# Patient Record
Sex: Male | Born: 1955 | Race: White | Hispanic: No | Marital: Married | Smoking: Never smoker
Health system: Southern US, Community
[De-identification: ages and names within clinical notes are randomized; demographics above are authoritative.]

## PROBLEM LIST (undated history)

## (undated) DIAGNOSIS — Z923 Personal history of irradiation: Secondary | ICD-10-CM

## (undated) DIAGNOSIS — Z87442 Personal history of urinary calculi: Secondary | ICD-10-CM

## (undated) DIAGNOSIS — R351 Nocturia: Secondary | ICD-10-CM

## (undated) DIAGNOSIS — C679 Malignant neoplasm of bladder, unspecified: Secondary | ICD-10-CM

## (undated) DIAGNOSIS — I351 Nonrheumatic aortic (valve) insufficiency: Secondary | ICD-10-CM

## (undated) DIAGNOSIS — H269 Unspecified cataract: Secondary | ICD-10-CM

## (undated) DIAGNOSIS — Z8546 Personal history of malignant neoplasm of prostate: Secondary | ICD-10-CM

## (undated) DIAGNOSIS — I10 Essential (primary) hypertension: Secondary | ICD-10-CM

## (undated) DIAGNOSIS — R002 Palpitations: Secondary | ICD-10-CM

## (undated) DIAGNOSIS — K219 Gastro-esophageal reflux disease without esophagitis: Secondary | ICD-10-CM

## (undated) DIAGNOSIS — R1310 Dysphagia, unspecified: Secondary | ICD-10-CM

## (undated) DIAGNOSIS — N401 Enlarged prostate with lower urinary tract symptoms: Secondary | ICD-10-CM

## (undated) DIAGNOSIS — N189 Chronic kidney disease, unspecified: Secondary | ICD-10-CM

## (undated) DIAGNOSIS — Z9079 Acquired absence of other genital organ(s): Secondary | ICD-10-CM

## (undated) DIAGNOSIS — N35919 Unspecified urethral stricture, male, unspecified site: Secondary | ICD-10-CM

## (undated) DIAGNOSIS — N420 Calculus of prostate: Secondary | ICD-10-CM

## (undated) DIAGNOSIS — C61 Malignant neoplasm of prostate: Secondary | ICD-10-CM

## (undated) DIAGNOSIS — Z973 Presence of spectacles and contact lenses: Secondary | ICD-10-CM

## (undated) DIAGNOSIS — IMO0002 Reserved for concepts with insufficient information to code with codable children: Secondary | ICD-10-CM

## (undated) DIAGNOSIS — N138 Other obstructive and reflux uropathy: Secondary | ICD-10-CM

## (undated) DIAGNOSIS — R361 Hematospermia: Secondary | ICD-10-CM

## (undated) HISTORY — DX: Malignant neoplasm of bladder, unspecified: C67.9

## (undated) HISTORY — PX: COLONOSCOPY: SHX174

## (undated) HISTORY — DX: Reserved for concepts with insufficient information to code with codable children: IMO0002

## (undated) HISTORY — DX: Gastro-esophageal reflux disease without esophagitis: K21.9

## (undated) HISTORY — DX: Acquired absence of other genital organ(s): Z90.79

---

## 2011-01-23 ENCOUNTER — Ambulatory Visit (INDEPENDENT_AMBULATORY_CARE_PROVIDER_SITE_OTHER): Payer: Managed Care, Other (non HMO) | Admitting: Urology

## 2011-01-23 DIAGNOSIS — R972 Elevated prostate specific antigen [PSA]: Secondary | ICD-10-CM

## 2011-01-23 DIAGNOSIS — N401 Enlarged prostate with lower urinary tract symptoms: Secondary | ICD-10-CM

## 2011-07-17 ENCOUNTER — Ambulatory Visit (INDEPENDENT_AMBULATORY_CARE_PROVIDER_SITE_OTHER): Payer: Managed Care, Other (non HMO) | Admitting: Urology

## 2011-07-17 DIAGNOSIS — N401 Enlarged prostate with lower urinary tract symptoms: Secondary | ICD-10-CM

## 2011-07-17 DIAGNOSIS — R972 Elevated prostate specific antigen [PSA]: Secondary | ICD-10-CM

## 2012-01-15 ENCOUNTER — Ambulatory Visit (INDEPENDENT_AMBULATORY_CARE_PROVIDER_SITE_OTHER): Payer: Managed Care, Other (non HMO) | Admitting: Urology

## 2012-01-15 DIAGNOSIS — R972 Elevated prostate specific antigen [PSA]: Secondary | ICD-10-CM

## 2012-01-15 DIAGNOSIS — N401 Enlarged prostate with lower urinary tract symptoms: Secondary | ICD-10-CM

## 2012-08-26 ENCOUNTER — Ambulatory Visit (INDEPENDENT_AMBULATORY_CARE_PROVIDER_SITE_OTHER): Payer: Managed Care, Other (non HMO) | Admitting: Urology

## 2012-08-26 DIAGNOSIS — N401 Enlarged prostate with lower urinary tract symptoms: Secondary | ICD-10-CM

## 2012-08-26 DIAGNOSIS — R972 Elevated prostate specific antigen [PSA]: Secondary | ICD-10-CM

## 2012-12-16 ENCOUNTER — Ambulatory Visit (INDEPENDENT_AMBULATORY_CARE_PROVIDER_SITE_OTHER): Payer: Managed Care, Other (non HMO) | Admitting: Urology

## 2012-12-16 DIAGNOSIS — N403 Nodular prostate with lower urinary tract symptoms: Secondary | ICD-10-CM

## 2012-12-16 DIAGNOSIS — R972 Elevated prostate specific antigen [PSA]: Secondary | ICD-10-CM

## 2013-01-04 DIAGNOSIS — C61 Malignant neoplasm of prostate: Secondary | ICD-10-CM

## 2013-01-04 HISTORY — PX: PROSTATE BIOPSY: SHX241

## 2013-01-04 HISTORY — DX: Malignant neoplasm of prostate: C61

## 2013-01-31 ENCOUNTER — Encounter: Payer: Self-pay | Admitting: Radiation Oncology

## 2013-01-31 NOTE — Progress Notes (Signed)
New Consult Prostate Cancer BX=01/04/13=Adenocarcinoma,gleason 3+4=7,& 3+3=6,Volume=63cc,PSA=3.94  Married,1 son,alert,oriented x3, no c/o pain, nocturia x3, -has frequency and urgency, regular bowels   Allergies:NKDA  No history of Radiation' No history of a Pacemaker Interested in [employer] Implantation

## 2013-02-01 ENCOUNTER — Ambulatory Visit
Admission: RE | Admit: 2013-02-01 | Discharge: 2013-02-01 | Disposition: A | Discharge status: Home / Self Care | Payer: Managed Care, Other (non HMO) | Source: Ambulatory Visit | Attending: Radiation Oncology | Admitting: Radiation Oncology

## 2013-02-01 ENCOUNTER — Encounter: Payer: Self-pay | Admitting: Radiation Oncology

## 2013-02-01 VITALS — BP 143/79 | HR 64 | Temp 98.4°F | Resp 20 | Ht 70.0 in | Wt 213.1 lb

## 2013-02-01 DIAGNOSIS — N138 Other obstructive and reflux uropathy: Secondary | ICD-10-CM | POA: Insufficient documentation

## 2013-02-01 DIAGNOSIS — C61 Malignant neoplasm of prostate: Secondary | ICD-10-CM | POA: Insufficient documentation

## 2013-02-01 DIAGNOSIS — I129 Hypertensive chronic kidney disease with stage 1 through stage 4 chronic kidney disease, or unspecified chronic kidney disease: Secondary | ICD-10-CM | POA: Insufficient documentation

## 2013-02-01 DIAGNOSIS — N189 Chronic kidney disease, unspecified: Secondary | ICD-10-CM | POA: Insufficient documentation

## 2013-02-01 HISTORY — DX: Hematospermia: R36.1

## 2013-02-01 HISTORY — DX: Unspecified cataract: H26.9

## 2013-02-01 HISTORY — DX: Nonrheumatic aortic (valve) insufficiency: I35.1

## 2013-02-01 HISTORY — DX: Benign prostatic hyperplasia with lower urinary tract symptoms: N40.1

## 2013-02-01 HISTORY — DX: Other obstructive and reflux uropathy: N13.8

## 2013-02-01 HISTORY — DX: Essential (primary) hypertension: I10

## 2013-02-01 HISTORY — DX: Nocturia: R35.1

## 2013-02-01 HISTORY — DX: Malignant neoplasm of prostate: C61

## 2013-02-01 HISTORY — DX: Chronic kidney disease, unspecified: N18.9

## 2013-02-01 NOTE — Progress Notes (Signed)
Please see the Nurse Progress Note in the MD Initial Consult Encounter for this patient. 

## 2013-02-02 ENCOUNTER — Encounter: Payer: Self-pay | Admitting: Radiation Oncology

## 2013-02-02 NOTE — Progress Notes (Signed)
Radiation Oncology         [phone]) [phone] ________________________________  Initial outpatient Consultation  Name: Jack Mcdonald MRN: [id number]  Date: 02/01/2013  DOB: 1956/05/23  CC:No primary provider on file.  Jack Crete, MD   REFERRING PHYSICIAN: Anner Crete, MD  DIAGNOSIS: 57 y.o. 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 gentleman.  He was noted to have an elevated PSA of 4.32 in 2011.  He was offered a biopsy at that time, but he elected to monitor the PSA, which decreased to less than 4, until it rose above 5 in Feb 2013, and subsequently dropped to 3.17, then, 3.86, and most recently 3.94 on 12/14/12.  His clinical evaluation at that time in urology by Dr. Annabell Howells on 1/17 included digital rectal examination revealing a 2+ prostate with a right apical nodule.  The patient proceeded to transrectal ultrasound with 12 biopsies of the prostate on 01/04/13.  The prostate volume measured 63 cc.  Out of 12 core biopsies,3 were positive.  The maximum Gleason score was 3+4, and this was seen in two cores from the right mid gland.  3+3 was seen in the left mid and apex.  The patient reviewed the biopsy results with his urologist and he has kindly been referred today for discussion of potential radiation treatment options.  PREVIOUS RADIATION THERAPY: No  PAST MEDICAL HISTORY:  has a past medical history of BPH with urinary obstruction; Hematospermia; Chronic kidney disease; Nocturia; Prostate cancer (01/04/13); Aortic regurgitation; Hypertension; and Cataract.    PAST SURGICAL HISTORY: Past Surgical History  Procedure Laterality Date  . Prostate biopsy  01/04/13    Adenocarncinoma    FAMILY HISTORY: family history is not on file.  SOCIAL HISTORY:  reports that he has never smoked. He has never used smokeless tobacco. He reports that  drinks alcohol. He reports that he does not  use illicit drugs.  ALLERGIES: Review of patient's allergies indicates no known allergies.  MEDICATIONS:  Current Outpatient Prescriptions  Medication Sig Dispense Refill  . aspirin 81 MG tablet Take 81 mg by mouth daily.      [name] b complex vitamins capsule Take 1 capsule by mouth daily.      . Flaxseed, Linseed, (FLAX SEEDS PO) Take 1 capsule by mouth daily.      . metoprolol (LOPRESSOR) 50 MG tablet Take 50 mg by mouth daily.      [name] telmisartan (MICARDIS) 40 MG tablet Take 40 mg by mouth daily.       No current facility-administered medications for this encounter.    REVIEW OF SYSTEMS:  A 15 point review of systems is documented in the electronic medical record. This was obtained by the nursing staff. However, I reviewed this with the patient to discuss relevant findings and make appropriate changes.  A comprehensive review of systems was negative..  The patient completed an IPSS and IIEF questionnaire.  His IPSS score was 15 indicating moderate urinary outflow obstructive symptoms.  He indicated that his erectile function is able to complete sexual activity on most attempts.   PHYSICAL EXAM: This patient is in no acute distress.  He is alert and oriented.   height is 5\' 10"  (1.778 m) and weight is 213 lb 1.6 oz (96.662 kg). His oral temperature is 98.4 F (36.9 C). His blood pressure is 143/79 and his pulse is 64. His  respiration is 20.  He exhibits no respiratory distress or labored breathing.  He appears neurologically intact.  His mood is pleasant.  His affect is appropriate.  Please note the digital rectal exam findings described above.  LABORATORY DATA:  No results found for this basename: WBC, HGB, HCT, MCV, PLT   No results found for this basename: NA, K, CL, CO2   No results found for this basename: ALT, AST, GGT, ALKPHOS, BILITOT     RADIOGRAPHY: No results found.    IMPRESSION: This gentleman is a 57 y.o. gentleman with stage T2a adenocarcinoma of the prostate with a  Gleason's score of 3+4 and a PSA of 3.94.  His T-Stage, Gleason's Score, and PSA put him into the intermediate risk group. However, his low volume of Gleason's 7 involvement and primary Gleason grade of 3 position him in a select subset of Gleason's 7 patients eligible for brachytherapy as monotherapy.  He is eligible for a variety of potential treatment options including prostatectomy, IMRT, and brachytherapy.  PLAN:Today I reviewed the findings and workup thus far.  We discussed the natural history of prostate cancer.  We reviewed the the implications of T-stage, Gleason's Score, and PSA on decision-making and outcomes in prostate cancer.  We discussed radiation treatment in the management of prostate cancer with regard to the logistics and delivery of external beam radiation treatment as well as the logistics and delivery of prostate brachytherapy.  We compared and contrasted each of these approaches and also compared these against prostatectomy.  The patient expressed interest in prostate brachytherapy.   Given his age, prostate enlargement, and urinary symptoms, we talked about some of the possible advantages of prostatectomy in this setting.  I counseled him frankly about the likelihood for obstructive symptoms/issues following seeds.  We also discussed possible proscar or avodart fr 3 months prior to implant to help mitigate these symptoms by downsizing the gland  The patient is still deciding about options.    I will share my findings with Dr. Annabell Howells and the patient will notify him once he reaches a decision.     I enjoyed meeting with him today, and will look forward to participating in the care of this very nice gentleman.  I spent 60 minutes face to face with the patient and more than 50% of that time was spent in counseling and/or coordination of care.   ------------------------------------------------  Artist Pais. Jack Mcdonald, M.D.

## 2013-02-02 NOTE — Addendum Note (Signed)
Encounter addended by: Oneita Hurt, MD on: 02/02/2013  5:31 PM<BR>     Documentation filed: LOS Section, Follow-up Section, Notes Section, Visit Diagnoses

## 2014-01-26 ENCOUNTER — Ambulatory Visit (INDEPENDENT_AMBULATORY_CARE_PROVIDER_SITE_OTHER): Payer: Managed Care, Other (non HMO) | Admitting: Urology

## 2014-01-26 DIAGNOSIS — N401 Enlarged prostate with lower urinary tract symptoms: Secondary | ICD-10-CM

## 2014-01-26 DIAGNOSIS — N138 Other obstructive and reflux uropathy: Secondary | ICD-10-CM

## 2014-01-26 DIAGNOSIS — C61 Malignant neoplasm of prostate: Secondary | ICD-10-CM

## 2014-08-03 ENCOUNTER — Ambulatory Visit (INDEPENDENT_AMBULATORY_CARE_PROVIDER_SITE_OTHER): Payer: Managed Care, Other (non HMO) | Admitting: Urology

## 2014-08-03 DIAGNOSIS — N403 Nodular prostate with lower urinary tract symptoms: Secondary | ICD-10-CM

## 2014-08-03 DIAGNOSIS — N138 Other obstructive and reflux uropathy: Secondary | ICD-10-CM

## 2014-08-03 DIAGNOSIS — C61 Malignant neoplasm of prostate: Secondary | ICD-10-CM

## 2015-02-01 ENCOUNTER — Ambulatory Visit (INDEPENDENT_AMBULATORY_CARE_PROVIDER_SITE_OTHER): Payer: BLUE CROSS/BLUE SHIELD | Admitting: Urology

## 2015-02-01 DIAGNOSIS — R312 Other microscopic hematuria: Secondary | ICD-10-CM | POA: Diagnosis not present

## 2015-02-01 DIAGNOSIS — R1030 Lower abdominal pain, unspecified: Secondary | ICD-10-CM

## 2015-02-01 DIAGNOSIS — N403 Nodular prostate with lower urinary tract symptoms: Secondary | ICD-10-CM

## 2015-02-01 DIAGNOSIS — Z8546 Personal history of malignant neoplasm of prostate: Secondary | ICD-10-CM | POA: Diagnosis not present

## 2015-02-01 DIAGNOSIS — N200 Calculus of kidney: Secondary | ICD-10-CM

## 2015-08-09 ENCOUNTER — Ambulatory Visit (INDEPENDENT_AMBULATORY_CARE_PROVIDER_SITE_OTHER): Payer: BLUE CROSS/BLUE SHIELD | Admitting: Urology

## 2015-08-09 DIAGNOSIS — R310 Gross hematuria: Secondary | ICD-10-CM | POA: Diagnosis not present

## 2015-08-09 DIAGNOSIS — Z8546 Personal history of malignant neoplasm of prostate: Secondary | ICD-10-CM | POA: Diagnosis not present

## 2015-08-09 DIAGNOSIS — N3040 Irradiation cystitis without hematuria: Secondary | ICD-10-CM | POA: Diagnosis not present

## 2015-11-08 ENCOUNTER — Ambulatory Visit (INDEPENDENT_AMBULATORY_CARE_PROVIDER_SITE_OTHER): Payer: BLUE CROSS/BLUE SHIELD | Admitting: Urology

## 2015-11-08 DIAGNOSIS — N401 Enlarged prostate with lower urinary tract symptoms: Secondary | ICD-10-CM | POA: Diagnosis not present

## 2015-11-08 DIAGNOSIS — N3040 Irradiation cystitis without hematuria: Secondary | ICD-10-CM | POA: Diagnosis not present

## 2015-11-08 DIAGNOSIS — N200 Calculus of kidney: Secondary | ICD-10-CM | POA: Diagnosis not present

## 2015-11-08 DIAGNOSIS — Z8546 Personal history of malignant neoplasm of prostate: Secondary | ICD-10-CM | POA: Diagnosis not present

## 2015-11-08 DIAGNOSIS — R310 Gross hematuria: Secondary | ICD-10-CM

## 2015-12-01 DIAGNOSIS — D126 Benign neoplasm of colon, unspecified: Secondary | ICD-10-CM

## 2015-12-01 HISTORY — DX: Benign neoplasm of colon, unspecified: D12.6

## 2016-05-29 ENCOUNTER — Ambulatory Visit (INDEPENDENT_AMBULATORY_CARE_PROVIDER_SITE_OTHER): Payer: BLUE CROSS/BLUE SHIELD | Admitting: Urology

## 2016-05-29 DIAGNOSIS — N3040 Irradiation cystitis without hematuria: Secondary | ICD-10-CM

## 2016-05-29 DIAGNOSIS — Z8546 Personal history of malignant neoplasm of prostate: Secondary | ICD-10-CM | POA: Diagnosis not present

## 2016-05-29 DIAGNOSIS — N200 Calculus of kidney: Secondary | ICD-10-CM

## 2016-05-29 DIAGNOSIS — N401 Enlarged prostate with lower urinary tract symptoms: Secondary | ICD-10-CM | POA: Diagnosis not present

## 2016-06-01 ENCOUNTER — Encounter (INDEPENDENT_AMBULATORY_CARE_PROVIDER_SITE_OTHER): Payer: Self-pay | Admitting: *Deleted

## 2016-06-08 ENCOUNTER — Encounter: Payer: Self-pay | Admitting: Gastroenterology

## 2016-07-31 ENCOUNTER — Ambulatory Visit (AMBULATORY_SURGERY_CENTER): Payer: Self-pay | Admitting: *Deleted

## 2016-07-31 VITALS — Ht 70.0 in | Wt 203.0 lb

## 2016-07-31 DIAGNOSIS — Z1211 Encounter for screening for malignant neoplasm of colon: Secondary | ICD-10-CM

## 2016-07-31 MED ORDER — NA SULFATE-K SULFATE-MG SULF 17.5-3.13-1.6 GM/177ML PO SOLN: 1.0000 | Freq: Once | ORAL | 0 refills | Status: AC

## 2016-07-31 NOTE — Progress Notes (Signed)
No egg or soy allergy known to patient  No issues with past sedation with any surgeries  or procedures, no intubation problems  No diet pills per patient No home 02 use per patient  No blood thinners per patient  Pt denies issues with constipation  No A fib or A flutter   Last colon 10+ yrs ago in [place]- normal per pt- had pt sign records release to get old report. To patty lewis RN  Sees cardio in [place]- states extra beat- aortic root enlargement <5, AV that is enlarged and leaking- Last echo with in the year or about a yr and EF was normal per pt-  Had pt sign records release for cardio reports, last echo etc.  Informed John of this above, pt states he can walk 2 flights of stairs with no issues.  Per Jenny Reichmann unless records state otherwise, ok for colon in [facility]

## 2016-08-14 ENCOUNTER — Encounter: Payer: Self-pay | Admitting: Gastroenterology

## 2016-08-14 ENCOUNTER — Ambulatory Visit (AMBULATORY_SURGERY_CENTER): Payer: BLUE CROSS/BLUE SHIELD | Admitting: Gastroenterology

## 2016-08-14 VITALS — BP 119/65 | HR 75 | Temp 98.0°F | Resp 18 | Ht 70.0 in | Wt 203.0 lb

## 2016-08-14 DIAGNOSIS — D123 Benign neoplasm of transverse colon: Secondary | ICD-10-CM

## 2016-08-14 DIAGNOSIS — Z1211 Encounter for screening for malignant neoplasm of colon: Secondary | ICD-10-CM

## 2016-08-14 MED ORDER — SODIUM CHLORIDE 0.9 % IV SOLN: 500.0000 mL | INTRAVENOUS | Status: DC

## 2016-08-14 NOTE — Patient Instructions (Signed)
Discharge instructions given. Handouts on polyps and diverticulosis. Resume previous medications. YOU HAD AN ENDOSCOPIC PROCEDURE TODAY AT THE [facility]:   Refer to the procedure report that was given to you for any specific questions about what was found during the examination.  If the procedure report does not answer your questions, please call your gastroenterologist to clarify.  If you requested that your care partner not be given the details of your procedure findings, then the procedure report has been included in a sealed envelope for you to review at your convenience later.  YOU SHOULD EXPECT: Some feelings of bloating in the abdomen. Passage of more gas than usual.  Walking can help get rid of the air that was put into your GI tract during the procedure and reduce the bloating. If you had a lower endoscopy (such as a colonoscopy or flexible sigmoidoscopy) you may notice spotting of blood in your stool or on the toilet paper. If you underwent a bowel prep for your procedure, you may not have a normal bowel movement for a few days.  Please Note:  You might notice some irritation and congestion in your nose or some drainage.  This is from the oxygen used during your procedure.  There is no need for concern and it should clear up in a day or so.  SYMPTOMS TO REPORT IMMEDIATELY:   Following lower endoscopy (colonoscopy or flexible sigmoidoscopy):  Excessive amounts of blood in the stool  Significant tenderness or worsening of abdominal pains  Swelling of the abdomen that is new, acute  Fever of 100F or higher   For urgent or emergent issues, a gastroenterologist can be reached at any hour by calling [phone].   DIET:  We do recommend a small meal at first, but then you may proceed to your regular diet.  Drink plenty of fluids but you should avoid alcoholic beverages for 24 hours.  ACTIVITY:  You should plan to take it easy for the rest of today and you should NOT  DRIVE or use heavy machinery until tomorrow (because of the sedation medicines used during the test).    FOLLOW UP: Our staff will call the number listed on your records the next business day following your procedure to check on you and address any questions or concerns that you may have regarding the information given to you following your procedure. If we do not reach you, we will leave a message.  However, if you are feeling well and you are not experiencing any problems, there is no need to return our call.  We will assume that you have returned to your regular daily activities without incident.  If any biopsies were taken you will be contacted by phone or by letter within the next 1-3 weeks.  Please call us at [phone] if you have not heard about the biopsies in 3 weeks.    SIGNATURES/CONFIDENTIALITY: You and/or your care partner have signed paperwork which will be entered into your electronic medical record.  These signatures attest to the fact that that the information above on your After Visit Summary has been reviewed and is understood.  Full responsibility of the confidentiality of this discharge information lies with you and/or your care-partner. 

## 2016-08-14 NOTE — Op Note (Signed)
[facility] Patient Name: Jack Mcdonald Procedure Date: 08/14/2016 10:21 AM MRN: [id number] Endoscopist: Ladene Artist , MD Age: 60 Referring MD:  Date of Birth: 10/28/1956 Gender: Male Account #: [id number] Procedure:                Colonoscopy Indications:              Screening for colorectal malignant neoplasm Medicines:                Monitored Anesthesia Care Procedure:                Pre-Anesthesia Assessment:                           - Prior to the procedure, a History and Physical                            was performed, and patient medications and                            allergies were reviewed. The patient's tolerance of                            previous anesthesia was also reviewed. The risks                            and benefits of the procedure and the sedation                            options and risks were discussed with the patient.                            All questions were answered, and informed consent                            was obtained. Prior Anticoagulants: The patient has                            taken no previous anticoagulant or antiplatelet                            agents. ASA Grade Assessment: II - A patient with                            mild systemic disease. After reviewing the risks                            and benefits, the patient was deemed in                            satisfactory condition to undergo the procedure.                           After obtaining informed consent, the colonoscope  was passed under direct vision. Throughout the                            procedure, the patient's blood pressure, pulse, and                            oxygen saturations were monitored continuously. The                            Model PCF-[id number]) scope was introduced                            through the anus and advanced to the the cecum,                            identified by  appendiceal orifice and ileocecal                            valve. The ileocecal valve, appendiceal orifice,                            and rectum were photographed. The quality of the                            bowel preparation was excellent. The colonoscopy                            was performed without difficulty. The patient                            tolerated the procedure well. Scope In: 10:25:01 AM Scope Out: 10:36:13 AM Scope Withdrawal Time: 0 hours 10 minutes 24 seconds  Total Procedure Duration: 0 hours 11 minutes 12 seconds  Findings:                 A 6 mm polyp was found in the transverse colon. The                            polyp was sessile. The polyp was removed with a                            cold snare. Resection and retrieval were complete.                           A few small-mouthed diverticula were found in the                            sigmoid colon.                           The exam was otherwise without abnormality on                            direct and retroflexion views.  A few medium-sized localized angiodysplastic                            lesions without bleeding were found in the rectum                            c/w mild radiation proctopathy. Complications:            No immediate complications. Estimated blood loss:                            None. Estimated Blood Loss:     Estimated blood loss: none. Impression:               - One 6 mm polyp in the transverse colon, removed                            with a cold snare. Resected and retrieved.                           - Mild radiation proctopathy                           - Diverticulosis in the sigmoid colon.                           - The examination was otherwise normal on direct                            and retroflexion views. Recommendation:           - Repeat colonoscopy in 5 years for surveillance if                            polyp is precancerous,  otherwise 10 years.                           - Patient has a contact number available for                            emergencies. The signs and symptoms of potential                            delayed complications were discussed with the                            patient. Return to normal activities tomorrow.                            Written discharge instructions were provided to the                            patient.                           - Resume previous diet.                           -  Continue present medications.                           - Await pathology results. Ladene Artist, MD 08/14/2016 10:41:29 AM This report has been signed electronically.

## 2016-08-14 NOTE — Progress Notes (Signed)
Report to PACU, RN, vss, BBS= Clear.  

## 2016-08-14 NOTE — Progress Notes (Signed)
Called to room for pathology. 

## 2016-08-17 ENCOUNTER — Telehealth: Payer: Self-pay | Admitting: *Deleted

## 2016-08-17 NOTE — Telephone Encounter (Signed)
No identifier, left message, follow-up  

## 2016-08-25 ENCOUNTER — Encounter: Payer: Self-pay | Admitting: Gastroenterology

## 2016-08-28 ENCOUNTER — Encounter: Payer: Self-pay | Admitting: Gastroenterology

## 2016-11-27 ENCOUNTER — Ambulatory Visit (INDEPENDENT_AMBULATORY_CARE_PROVIDER_SITE_OTHER): Payer: BLUE CROSS/BLUE SHIELD | Admitting: Urology

## 2016-11-27 DIAGNOSIS — C61 Malignant neoplasm of prostate: Secondary | ICD-10-CM | POA: Diagnosis not present

## 2016-11-27 DIAGNOSIS — N401 Enlarged prostate with lower urinary tract symptoms: Secondary | ICD-10-CM

## 2016-11-27 DIAGNOSIS — R351 Nocturia: Secondary | ICD-10-CM

## 2017-05-28 ENCOUNTER — Ambulatory Visit: Payer: BLUE CROSS/BLUE SHIELD | Admitting: Urology

## 2017-05-28 ENCOUNTER — Ambulatory Visit (INDEPENDENT_AMBULATORY_CARE_PROVIDER_SITE_OTHER): Payer: BLUE CROSS/BLUE SHIELD | Admitting: Urology

## 2017-05-28 DIAGNOSIS — N401 Enlarged prostate with lower urinary tract symptoms: Secondary | ICD-10-CM | POA: Diagnosis not present

## 2017-05-28 DIAGNOSIS — C61 Malignant neoplasm of prostate: Secondary | ICD-10-CM

## 2017-11-26 ENCOUNTER — Ambulatory Visit (INDEPENDENT_AMBULATORY_CARE_PROVIDER_SITE_OTHER): Payer: PRIVATE HEALTH INSURANCE | Admitting: Urology

## 2017-11-26 DIAGNOSIS — N401 Enlarged prostate with lower urinary tract symptoms: Secondary | ICD-10-CM | POA: Diagnosis not present

## 2017-11-26 DIAGNOSIS — Z87442 Personal history of urinary calculi: Secondary | ICD-10-CM | POA: Diagnosis not present

## 2017-11-26 DIAGNOSIS — Z8546 Personal history of malignant neoplasm of prostate: Secondary | ICD-10-CM | POA: Diagnosis not present

## 2017-11-26 DIAGNOSIS — N3040 Irradiation cystitis without hematuria: Secondary | ICD-10-CM

## 2017-11-26 DIAGNOSIS — R351 Nocturia: Secondary | ICD-10-CM | POA: Diagnosis not present

## 2018-05-27 ENCOUNTER — Ambulatory Visit (INDEPENDENT_AMBULATORY_CARE_PROVIDER_SITE_OTHER): Payer: BLUE CROSS/BLUE SHIELD | Admitting: Urology

## 2018-05-27 DIAGNOSIS — Z8546 Personal history of malignant neoplasm of prostate: Secondary | ICD-10-CM

## 2018-05-27 DIAGNOSIS — R351 Nocturia: Secondary | ICD-10-CM | POA: Diagnosis not present

## 2018-05-27 DIAGNOSIS — R310 Gross hematuria: Secondary | ICD-10-CM | POA: Diagnosis not present

## 2018-05-27 DIAGNOSIS — Z87442 Personal history of urinary calculi: Secondary | ICD-10-CM

## 2018-05-27 DIAGNOSIS — N401 Enlarged prostate with lower urinary tract symptoms: Secondary | ICD-10-CM | POA: Diagnosis not present

## 2018-11-18 ENCOUNTER — Ambulatory Visit (INDEPENDENT_AMBULATORY_CARE_PROVIDER_SITE_OTHER): Payer: BLUE CROSS/BLUE SHIELD | Admitting: Urology

## 2018-11-18 DIAGNOSIS — Z8546 Personal history of malignant neoplasm of prostate: Secondary | ICD-10-CM | POA: Diagnosis not present

## 2018-11-18 DIAGNOSIS — Z87442 Personal history of urinary calculi: Secondary | ICD-10-CM

## 2018-11-18 DIAGNOSIS — N401 Enlarged prostate with lower urinary tract symptoms: Secondary | ICD-10-CM

## 2018-11-18 DIAGNOSIS — R351 Nocturia: Secondary | ICD-10-CM | POA: Diagnosis not present

## 2019-03-16 ENCOUNTER — Ambulatory Visit: Payer: BLUE CROSS/BLUE SHIELD | Admitting: Gastroenterology

## 2019-10-13 ENCOUNTER — Other Ambulatory Visit (HOSPITAL_COMMUNITY)
Admission: RE | Admit: 2019-10-13 | Discharge: 2019-10-13 | Disposition: A | Discharge status: Home / Self Care | Payer: Medicare Other | Source: Other Acute Inpatient Hospital | Attending: Urology | Admitting: Urology

## 2019-10-13 ENCOUNTER — Other Ambulatory Visit: Payer: Self-pay

## 2019-10-13 ENCOUNTER — Ambulatory Visit (INDEPENDENT_AMBULATORY_CARE_PROVIDER_SITE_OTHER): Payer: Medicare Other | Admitting: Urology

## 2019-10-13 DIAGNOSIS — Z8546 Personal history of malignant neoplasm of prostate: Secondary | ICD-10-CM | POA: Diagnosis not present

## 2019-10-13 DIAGNOSIS — R310 Gross hematuria: Secondary | ICD-10-CM | POA: Diagnosis not present

## 2019-10-13 DIAGNOSIS — R300 Dysuria: Secondary | ICD-10-CM | POA: Diagnosis not present

## 2019-10-13 DIAGNOSIS — N401 Enlarged prostate with lower urinary tract symptoms: Secondary | ICD-10-CM

## 2019-10-13 DIAGNOSIS — Z87442 Personal history of urinary calculi: Secondary | ICD-10-CM

## 2019-10-13 DIAGNOSIS — N3040 Irradiation cystitis without hematuria: Secondary | ICD-10-CM

## 2019-10-13 LAB — URINALYSIS, COMPLETE (UACMP) WITH MICROSCOPIC
Bacteria, UA: NONE SEEN
Bilirubin Urine: NEGATIVE
Glucose, UA: NEGATIVE mg/dL
Ketones, ur: NEGATIVE mg/dL
Nitrite: NEGATIVE
Protein, ur: 30 mg/dL — AB
Specific Gravity, Urine: 1.018 (ref 1.005–1.030)
WBC, UA: >50 WBC/hpf — ABNORMAL HIGH (ref 0–5)
pH: 7 (ref 5.0–8.0)

## 2019-10-14 LAB — URINE CULTURE: Culture: NO GROWTH

## 2019-10-17 ENCOUNTER — Other Ambulatory Visit: Payer: Self-pay | Admitting: Urology

## 2019-10-17 ENCOUNTER — Other Ambulatory Visit (HOSPITAL_COMMUNITY): Payer: Self-pay | Admitting: Urology

## 2019-10-17 DIAGNOSIS — R310 Gross hematuria: Secondary | ICD-10-CM

## 2019-10-20 ENCOUNTER — Other Ambulatory Visit: Payer: Self-pay | Admitting: Urology

## 2019-10-20 ENCOUNTER — Other Ambulatory Visit (HOSPITAL_COMMUNITY)
Admission: RE | Admit: 2019-10-20 | Discharge: 2019-10-20 | Disposition: A | Discharge status: Home / Self Care | Payer: Medicare Other | Source: Ambulatory Visit | Attending: Urology | Admitting: Urology

## 2019-10-20 ENCOUNTER — Ambulatory Visit (INDEPENDENT_AMBULATORY_CARE_PROVIDER_SITE_OTHER): Payer: Medicare Other | Admitting: Urology

## 2019-10-20 DIAGNOSIS — R310 Gross hematuria: Secondary | ICD-10-CM

## 2019-10-20 DIAGNOSIS — D414 Neoplasm of uncertain behavior of bladder: Secondary | ICD-10-CM | POA: Diagnosis not present

## 2019-10-21 ENCOUNTER — Other Ambulatory Visit (HOSPITAL_COMMUNITY)
Admission: RE | Admit: 2019-10-21 | Discharge: 2019-10-21 | Disposition: A | Discharge status: Home / Self Care | Payer: Medicare Other | Source: Ambulatory Visit | Attending: Urology | Admitting: Urology

## 2019-10-21 DIAGNOSIS — Z01812 Encounter for preprocedural laboratory examination: Secondary | ICD-10-CM | POA: Diagnosis present

## 2019-10-21 DIAGNOSIS — Z20828 Contact with and (suspected) exposure to other viral communicable diseases: Secondary | ICD-10-CM | POA: Insufficient documentation

## 2019-10-21 LAB — SARS CORONAVIRUS 2 (TAT 6-24 HRS): SARS Coronavirus 2: NEGATIVE

## 2019-10-21 MED ORDER — GEMCITABINE CHEMO FOR BLADDER INSTILLATION 2000 MG: 2000.0000 mg | Freq: Once | INTRAVENOUS | Status: AC

## 2019-10-23 LAB — CYTOLOGY - NON PAP

## 2019-11-03 ENCOUNTER — Other Ambulatory Visit: Payer: Self-pay

## 2019-11-03 ENCOUNTER — Ambulatory Visit (HOSPITAL_COMMUNITY)
Admission: RE | Admit: 2019-11-03 | Discharge: 2019-11-03 | Disposition: A | Discharge status: Home / Self Care | Payer: Medicare Other | Source: Ambulatory Visit | Attending: Urology | Admitting: Urology

## 2019-11-03 DIAGNOSIS — R310 Gross hematuria: Secondary | ICD-10-CM | POA: Insufficient documentation

## 2019-11-03 LAB — POCT I-STAT CREATININE: Creatinine, Ser: 0.8 mg/dL (ref 0.61–1.24)

## 2019-11-03 MED ORDER — IOHEXOL 300 MG/ML  SOLN
125.0000 mL | Freq: Once | INTRAMUSCULAR | Status: AC | PRN
  Administered 2019-11-03: 125 mL via INTRAVENOUS

## 2019-11-06 ENCOUNTER — Other Ambulatory Visit (HOSPITAL_COMMUNITY)
Admission: RE | Admit: 2019-11-06 | Discharge: 2019-11-06 | Disposition: A | Discharge status: Home / Self Care | Payer: Medicare Other | Source: Ambulatory Visit | Attending: Urology | Admitting: Urology

## 2019-11-06 ENCOUNTER — Other Ambulatory Visit: Payer: Self-pay

## 2019-11-06 ENCOUNTER — Encounter (HOSPITAL_BASED_OUTPATIENT_CLINIC_OR_DEPARTMENT_OTHER): Payer: Self-pay | Admitting: *Deleted

## 2019-11-06 DIAGNOSIS — Z20828 Contact with and (suspected) exposure to other viral communicable diseases: Secondary | ICD-10-CM | POA: Diagnosis not present

## 2019-11-06 DIAGNOSIS — Z01812 Encounter for preprocedural laboratory examination: Secondary | ICD-10-CM | POA: Diagnosis present

## 2019-11-06 NOTE — Progress Notes (Addendum)
Spoke w/ via phone for pre-op interview---[facility] needs dos----   I stat 8, ekg           Lab results------lov dr Carrolyn Leigh cardiology 09-21-18 on chart, echo 06-24-19 dr Gibson Ramp on chart COVID test ------11-06-2019 Arrive at -------915 am NPO after ------midnight Medications to take morning of surgery -----metoprolol Diabetic medication -----n/a Patient Special Instructions ----- Pre-Op special Istructions ----- Patient verbalized understanding of instructions that were given at this phone interview. Patient denies shortness of breath, chest pain, fever, cough a this phone interview.

## 2019-11-07 ENCOUNTER — Encounter (HOSPITAL_BASED_OUTPATIENT_CLINIC_OR_DEPARTMENT_OTHER): Payer: Self-pay | Admitting: *Deleted

## 2019-11-07 LAB — NOVEL CORONAVIRUS, NAA (HOSP ORDER, SEND-OUT TO REF LAB; TAT 18-24 HRS): SARS-CoV-2, NAA: NOT DETECTED

## 2019-11-09 ENCOUNTER — Ambulatory Visit (HOSPITAL_BASED_OUTPATIENT_CLINIC_OR_DEPARTMENT_OTHER)
Admission: RE | Admit: 2019-11-09 | Discharge: 2019-11-09 | Disposition: A | Discharge status: Home / Self Care | Payer: Medicare Other | Attending: Urology | Admitting: Urology

## 2019-11-09 ENCOUNTER — Ambulatory Visit (HOSPITAL_BASED_OUTPATIENT_CLINIC_OR_DEPARTMENT_OTHER): Payer: Medicare Other | Admitting: Anesthesiology

## 2019-11-09 ENCOUNTER — Encounter (HOSPITAL_BASED_OUTPATIENT_CLINIC_OR_DEPARTMENT_OTHER): Payer: Self-pay | Admitting: Urology

## 2019-11-09 ENCOUNTER — Other Ambulatory Visit: Payer: Self-pay

## 2019-11-09 ENCOUNTER — Encounter (HOSPITAL_BASED_OUTPATIENT_CLINIC_OR_DEPARTMENT_OTHER)
Admission: RE | Disposition: A | Discharge status: Home / Self Care | Payer: Self-pay | Source: Home / Self Care | Attending: Urology

## 2019-11-09 DIAGNOSIS — K219 Gastro-esophageal reflux disease without esophagitis: Secondary | ICD-10-CM | POA: Diagnosis not present

## 2019-11-09 DIAGNOSIS — I10 Essential (primary) hypertension: Secondary | ICD-10-CM | POA: Insufficient documentation

## 2019-11-09 DIAGNOSIS — Z8546 Personal history of malignant neoplasm of prostate: Secondary | ICD-10-CM | POA: Diagnosis not present

## 2019-11-09 DIAGNOSIS — Z87442 Personal history of urinary calculi: Secondary | ICD-10-CM | POA: Insufficient documentation

## 2019-11-09 DIAGNOSIS — Z841 Family history of disorders of kidney and ureter: Secondary | ICD-10-CM | POA: Diagnosis not present

## 2019-11-09 DIAGNOSIS — R310 Gross hematuria: Secondary | ICD-10-CM | POA: Diagnosis not present

## 2019-11-09 DIAGNOSIS — C670 Malignant neoplasm of trigone of bladder: Secondary | ICD-10-CM | POA: Insufficient documentation

## 2019-11-09 DIAGNOSIS — C675 Malignant neoplasm of bladder neck: Secondary | ICD-10-CM | POA: Diagnosis not present

## 2019-11-09 DIAGNOSIS — Z923 Personal history of irradiation: Secondary | ICD-10-CM | POA: Insufficient documentation

## 2019-11-09 DIAGNOSIS — Z79899 Other long term (current) drug therapy: Secondary | ICD-10-CM | POA: Insufficient documentation

## 2019-11-09 DIAGNOSIS — R1310 Dysphagia, unspecified: Secondary | ICD-10-CM | POA: Diagnosis not present

## 2019-11-09 DIAGNOSIS — Z8249 Family history of ischemic heart disease and other diseases of the circulatory system: Secondary | ICD-10-CM | POA: Insufficient documentation

## 2019-11-09 DIAGNOSIS — C61 Malignant neoplasm of prostate: Secondary | ICD-10-CM | POA: Insufficient documentation

## 2019-11-09 HISTORY — DX: Dysphagia, unspecified: R13.10

## 2019-11-09 HISTORY — DX: Personal history of urinary calculi: Z87.442

## 2019-11-09 HISTORY — DX: Nonrheumatic aortic (valve) insufficiency: I35.1

## 2019-11-09 HISTORY — PX: TRANSURETHRAL RESECTION OF BLADDER TUMOR WITH MITOMYCIN-C: SHX6459

## 2019-11-09 LAB — POCT I-STAT, CHEM 8
BUN: 9 mg/dL (ref 8–23)
Calcium, Ion: 1.26 mmol/L (ref 1.15–1.40)
Chloride: 102 mmol/L (ref 98–111)
Creatinine, Ser: 0.6 mg/dL — ABNORMAL LOW (ref 0.61–1.24)
Glucose, Bld: 99 mg/dL (ref 70–99)
HCT: 43 % (ref 39.0–52.0)
Hemoglobin: 14.6 g/dL (ref 13.0–17.0)
Potassium: 4 mmol/L (ref 3.5–5.1)
Sodium: 137 mmol/L (ref 135–145)
TCO2: 25 mmol/L (ref 22–32)

## 2019-11-09 SURGERY — TRANSURETHRAL RESECTION OF BLADDER TUMOR WITH MITOMYCIN-C
Anesthesia: General | Site: Bladder | Laterality: Bilateral

## 2019-11-09 MED ORDER — LIDOCAINE HCL (CARDIAC) PF 100 MG/5ML IV SOSY
PREFILLED_SYRINGE | INTRAVENOUS | Status: DC | PRN
  Administered 2019-11-09: 60 mg via INTRAVENOUS

## 2019-11-09 MED ORDER — PROPOFOL 10 MG/ML IV BOLUS
INTRAVENOUS | Status: AC
  Filled 2019-11-09: qty 20

## 2019-11-09 MED ORDER — LIDOCAINE 2% (20 MG/ML) 5 ML SYRINGE
INTRAMUSCULAR | Status: AC
  Filled 2019-11-09: qty 5

## 2019-11-09 MED ORDER — FENTANYL CITRATE (PF) 100 MCG/2ML IJ SOLN
25.0000 ug | INTRAMUSCULAR | Status: DC | PRN
  Filled 2019-11-09: qty 1

## 2019-11-09 MED ORDER — MIDAZOLAM HCL 2 MG/2ML IJ SOLN
INTRAMUSCULAR | Status: AC
  Filled 2019-11-09: qty 2

## 2019-11-09 MED ORDER — DEXAMETHASONE SODIUM PHOSPHATE 4 MG/ML IJ SOLN
INTRAMUSCULAR | Status: DC | PRN
  Administered 2019-11-09: 10 mg via INTRAVENOUS

## 2019-11-09 MED ORDER — ONDANSETRON HCL 4 MG/2ML IJ SOLN
INTRAMUSCULAR | Status: DC | PRN
  Administered 2019-11-09: 4 mg via INTRAVENOUS

## 2019-11-09 MED ORDER — PROPOFOL 10 MG/ML IV BOLUS
INTRAVENOUS | Status: DC | PRN
  Administered 2019-11-09: 50 mg via INTRAVENOUS
  Administered 2019-11-09: 150 mg via INTRAVENOUS

## 2019-11-09 MED ORDER — GEMCITABINE CHEMO FOR BLADDER INSTILLATION 2000 MG
2000.0000 mg | Freq: Once | INTRAVENOUS | Status: DC
  Filled 2019-11-09: qty 52.6

## 2019-11-09 MED ORDER — CEFAZOLIN SODIUM-DEXTROSE 2-4 GM/100ML-% IV SOLN
2.0000 g | INTRAVENOUS | Status: AC
  Administered 2019-11-09: 2 g via INTRAVENOUS
  Filled 2019-11-09: qty 100

## 2019-11-09 MED ORDER — DEXAMETHASONE SODIUM PHOSPHATE 10 MG/ML IJ SOLN
INTRAMUSCULAR | Status: AC
  Filled 2019-11-09: qty 1

## 2019-11-09 MED ORDER — SODIUM CHLORIDE 0.9 % IR SOLN
Status: DC | PRN
  Administered 2019-11-09: 6000 mL via INTRAVESICAL

## 2019-11-09 MED ORDER — ONDANSETRON HCL 4 MG/2ML IJ SOLN
INTRAMUSCULAR | Status: AC
  Filled 2019-11-09: qty 2

## 2019-11-09 MED ORDER — FENTANYL CITRATE (PF) 100 MCG/2ML IJ SOLN
INTRAMUSCULAR | Status: DC | PRN
  Administered 2019-11-09: 25 ug via INTRAVENOUS
  Administered 2019-11-09 (×2): 50 ug via INTRAVENOUS
  Administered 2019-11-09 (×3): 25 ug via INTRAVENOUS

## 2019-11-09 MED ORDER — OXYCODONE HCL 5 MG PO TABS
5.0000 mg | ORAL_TABLET | Freq: Once | ORAL | Status: DC | PRN
  Filled 2019-11-09: qty 1

## 2019-11-09 MED ORDER — MIDAZOLAM HCL 5 MG/5ML IJ SOLN
INTRAMUSCULAR | Status: DC | PRN
  Administered 2019-11-09: 2 mg via INTRAVENOUS

## 2019-11-09 MED ORDER — LACTATED RINGERS IV SOLN
INTRAVENOUS | Status: DC
  Administered 2019-11-09: 11:00:00 via INTRAVENOUS
  Filled 2019-11-09 (×2): qty 1000

## 2019-11-09 MED ORDER — ONDANSETRON HCL 4 MG/2ML IJ SOLN
4.0000 mg | Freq: Once | INTRAMUSCULAR | Status: DC | PRN
  Filled 2019-11-09: qty 2

## 2019-11-09 MED ORDER — FENTANYL CITRATE (PF) 100 MCG/2ML IJ SOLN
INTRAMUSCULAR | Status: AC
  Filled 2019-11-09: qty 2

## 2019-11-09 MED ORDER — CEFAZOLIN SODIUM-DEXTROSE 2-4 GM/100ML-% IV SOLN
INTRAVENOUS | Status: AC
  Filled 2019-11-09: qty 100

## 2019-11-09 MED ORDER — OXYCODONE HCL 5 MG/5ML PO SOLN
5.0000 mg | Freq: Once | ORAL | Status: DC | PRN
  Filled 2019-11-09: qty 5

## 2019-11-09 SURGICAL SUPPLY — 26 items
BAG DRAIN URO-CYSTO SKYTR STRL (DRAIN) ×3 IMPLANT
BAG URINE DRAIN 2000ML AR STRL (UROLOGICAL SUPPLIES) IMPLANT
BAG URINE LEG 500ML (DRAIN) ×3 IMPLANT
CATH FOLEY 2WAY SLVR  5CC 18FR (CATHETERS)
CATH FOLEY 2WAY SLVR  5CC 20FR (CATHETERS)
CATH FOLEY 2WAY SLVR  5CC 22FR (CATHETERS) ×2
CATH FOLEY 2WAY SLVR 5CC 18FR (CATHETERS) IMPLANT
CATH FOLEY 2WAY SLVR 5CC 20FR (CATHETERS) IMPLANT
CATH FOLEY 2WAY SLVR 5CC 22FR (CATHETERS) ×1 IMPLANT
CATH URET 5FR 28IN OPEN ENDED (CATHETERS) ×3 IMPLANT
CLOTH BEACON ORANGE TIMEOUT ST (SAFETY) ×3 IMPLANT
DRSG TELFA 3X8 NADH (GAUZE/BANDAGES/DRESSINGS) ×3 IMPLANT
ELECT REM PT RETURN 9FT ADLT (ELECTROSURGICAL) ×3
ELECTRODE REM PT RTRN 9FT ADLT (ELECTROSURGICAL) ×1 IMPLANT
GLOVE SURG SS PI 8.0 STRL IVOR (GLOVE) ×3 IMPLANT
GOWN STRL REUS W/TWL XL LVL3 (GOWN DISPOSABLE) ×3 IMPLANT
HOLDER FOLEY CATH W/STRAP (MISCELLANEOUS) IMPLANT
IV NS IRRIG 3000ML ARTHROMATIC (IV SOLUTION) ×6 IMPLANT
KIT TURNOVER CYSTO (KITS) ×3 IMPLANT
MANIFOLD NEPTUNE II (INSTRUMENTS) ×3 IMPLANT
PACK CYSTO (CUSTOM PROCEDURE TRAY) ×3 IMPLANT
PLUG CATH AND CAP STER (CATHETERS) IMPLANT
SYR 10ML LL (SYRINGE) ×3 IMPLANT
SYRINGE IRR TOOMEY STRL 70CC (SYRINGE) IMPLANT
TUBE CONNECTING 12'X1/4 (SUCTIONS) ×1
TUBE CONNECTING 12X1/4 (SUCTIONS) ×2 IMPLANT

## 2019-11-09 NOTE — Anesthesia Procedure Notes (Signed)
Procedure Name: LMA Insertion Date/Time: 11/09/2019 11:27 AM Performed by: Justice Rocher, CRNA Pre-anesthesia Checklist: Patient identified, Emergency Drugs available, Suction available and Patient being monitored Patient Re-evaluated:Patient Re-evaluated prior to induction Oxygen Delivery Method: Circle system utilized Preoxygenation: Pre-oxygenation with 100% oxygen Induction Type: IV induction Ventilation: Mask ventilation without difficulty LMA: LMA inserted LMA Size: 4.0 Number of attempts: 1 Airway Equipment and Method: Bite block Placement Confirmation: positive ETCO2 and breath sounds checked- equal and bilateral Tube secured with: Tape Dental Injury: Teeth and Oropharynx as per pre-operative assessment

## 2019-11-09 NOTE — Transfer of Care (Signed)
Immediate Anesthesia Transfer of Care Note  Patient: Jack Mcdonald  Procedure(s) Performed: Procedure(s) (LRB): TRANSURETHRAL RESECTION OF BLADDER TUMOR (Bilateral)  Patient Location: PACU  Anesthesia Type: General  Level of Consciousness: awake, sedated, patient cooperative and responds to stimulation  Airway & Oxygen Therapy: Patient Spontanous Breathing and Patient connected to NC02 and soft FM  Post-op Assessment: Report given to PACU RN, Post -op Vital signs reviewed and stable and Patient moving all extremities  Post vital signs: Reviewed and stable  Complications: No apparent anesthesia complications

## 2019-11-09 NOTE — Interval H&P Note (Signed)
Cytology negative.  CT showed small renal stones.  Possible mass at base of bladder.

## 2019-11-09 NOTE — Anesthesia Preprocedure Evaluation (Addendum)
Anesthesia Evaluation  Patient identified by MRN, date of birth, ID band Patient awake    Reviewed: Allergy & Precautions, NPO status , Patient's Chart, lab work & pertinent test results  History of Anesthesia Complications Negative for: history of anesthetic complications  Airway Mallampati: II  TM Distance: >3 FB Neck ROM: Full    Dental  (+) Dental Advisory Given, Teeth Intact   Pulmonary neg pulmonary ROS,    Pulmonary exam normal        Cardiovascular hypertension, Pt. on home beta blockers and Pt. on medications + dysrhythmias + Valvular Problems/Murmurs AI  Rhythm:Regular Rate:Normal + Diastolic murmurs  '16 TTE - EF ~60%, concentric LVH. Mild-mod AI, mild MR and TR.    Neuro/Psych negative neurological ROS  negative psych ROS   GI/Hepatic Neg liver ROS, GERD  Medicated, Dysphagia    Endo/Other  negative endocrine ROS  Renal/GU negative Renal ROS     Musculoskeletal negative musculoskeletal ROS (+)   Abdominal   Peds  Hematology negative hematology ROS (+)   Anesthesia Other Findings Covid negative 12/7   Reproductive/Obstetrics                            Anesthesia Physical Anesthesia Plan  ASA: III  Anesthesia Plan: General   Post-op Pain Management:    Induction: Intravenous  PONV Risk Score and Plan: 2 and Treatment may vary due to age or medical condition, Ondansetron and Dexamethasone  Airway Management Planned: LMA  Additional Equipment: None  Intra-op Plan:   Post-operative Plan: Extubation in OR  Informed Consent: I have reviewed the patients History and Physical, chart, labs and discussed the procedure including the risks, benefits and alternatives for the proposed anesthesia with the patient or authorized representative who has indicated his/her understanding and acceptance.     Dental advisory given  Plan Discussed with: CRNA and  Anesthesiologist  Anesthesia Plan Comments: (LMA unless surgeon requires relaxation, in which case ETT)       Anesthesia Quick Evaluation

## 2019-11-09 NOTE — Anesthesia Postprocedure Evaluation (Signed)
Anesthesia Post Note  Patient: Jack Mcdonald  Procedure(s) Performed: TRANSURETHRAL RESECTION OF BLADDER TUMOR (Bilateral Bladder)     Patient location during evaluation: PACU Anesthesia Type: General Level of consciousness: awake and alert Pain management: pain level controlled Vital Signs Assessment: post-procedure vital signs reviewed and stable Respiratory status: spontaneous breathing, nonlabored ventilation and respiratory function stable Cardiovascular status: blood pressure returned to baseline and stable Postop Assessment: no apparent nausea or vomiting Anesthetic complications: no    Last Vitals:  Vitals:   11/09/19 1230 11/09/19 1245  BP:    Pulse: 73 72  Resp: 16 13  Temp:    SpO2: 98% 95%    Last Pain:  Vitals:   11/09/19 1245  TempSrc:   PainSc: 0-No pain                 Audry Pili

## 2019-11-09 NOTE — H&P (Signed)
CC/HPI: I have blood in my urine.     Jack Mcdonald returns today in f/u for cystoscopy to evaluate his recent history of hematuria with dysuria. The culture was negative and his symptoms didn't improve with the bactrim given pending the culture. He has a history of prostate cancer with radiation in 2014. Jack Mcdonald He has had prior stones. 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     ALLERGIES: No Allergies    MEDICATIONS: Bactrim Ds 800 mg-160 mg tablet 1 tablet PO BID  Tamsulosin Hcl 0.4 mg capsule 1 capsule PO Q HS  Lopressor TABS Oral  Micardis 40 MG Oral Tablet Oral  Zantac 150 mg capsule Oral     GU PSH: No GU PSH      PSH Notes: No Surgical Problems   NON-GU PSH: No Non-GU PSH    GU PMH: BPH w/LUTS, He will stay on tamsulosin. - 10/13/2019, He is voiding well on tamsulosin but is concerned about swallowing issues. I have refilled the med and discussed alternatives and if he wants to try to stop it that is ok. , - 11/18/2018 (Stable), He is voiding well on tamsulosin. , - 05/27/2018, He is voiding well on tamsulosin. , - 11/26/2017, - 2018, - 2017 (Stable), Stable voiding symptoms on tamsulosin., - 2017, Benign prostatic hyperplasia with urinary obstruction, - 2016 Gross hematuria, He has new onset gross hematuria that could be from a stone, UTI or radiation cystitis or possibly an neoplasm. I will culture the urine and start bactrim and then get him set up for a CT and cystoscopy. - 10/13/2019, (Improving), He is doing well with resolution after UTI treatment. He does have some minor proteinuria today and we will monitor that and get a 24hr urine if it persists at f/u. He Cr was 0.88 in March. , - 05/27/2018 (Worsening), He has gross hematuria with a history of stones and radiation cystitis. I am going to get a culture and start him on bactrim because of the pyuria and dysuria. I will get him set up for a CT AP w/wo in [place] since he is out of network here. He will return for possible  cystoscopy or I may set him up for outpatient cystoscopy with fulguration depending on the CT. , - 2019, Gross hematuria, - 2016 History of prostate cancer, PSA is stable. - 10/13/2019, PSA is minimally changed. Repeat in 6 months with an exam. , - 11/18/2018 (Stable), PSA is back down slightly. F/U in 6 months and if it is still stable we can go to a year. , - 05/27/2018, His PSA remains low but has trended up. I will repeat in 6 months and if there is a further increase, we may need to consider restaging. , - 11/26/2017, History of malignant neoplasm of prostate, - 2016 Dysuria - 2019 History of urolithiasis, He has had no symptoms to suggest recurrent disease. - 11/26/2017 Prostate Cancer - 2018, - 2017 Radiation cystitis (w/o hematuria), Irradiation cystitis - 2016 Renal calculus, Kidney stone on right side - 2016, Kidney stone on left side, - 2016 Other microscopic hematuria, Microscopic hematuria - 2016 Lower abdominal pain, unspecified, Groin pain, left - 2016 Prostate nodule w/ LUTS, Nodular prostate with lower urinary tract symptoms - 2016 Elevated PSA, Elevated prostate specific antigen (PSA) - 2015 Hematospermia, Hematospermia - 2014 Nocturia, Nocturia - 2014      PMH Notes:  2010-07-28 10:13:37 - Note: Aortic Regurgitation  2010-07-28 09:33:38 - Note: Arthritis   NON-GU PMH:  Encounter for general adult medical examination without abnormal findings, Encounter for preventive health examination - 2016 Personal history of other diseases of the circulatory system, History of hypertension - 2014    FAMILY HISTORY: Congestive Heart Failure - Mother Death In The Family Father - Runs In Family Death In The Family Mother - Runs In Family Family Health Status Number - Runs In Family nephrolithiasis - Brother Nonfunctioning Kidney - Father   SOCIAL HISTORY: No Social History     Notes: Caffeine Use, Never A Smoker, Marital History - Currently Married, Alcohol Use, Occupation:   REVIEW  OF SYSTEMS:    GU Review Male:   Patient reports frequent urination and burning/ pain with urination. Patient denies hard to postpone urination, get up at night to urinate, leakage of urine, stream starts and stops, trouble starting your stream, have to strain to urinate , erection problems, and penile pain.  Gastrointestinal (Upper):   Patient denies nausea, vomiting, and indigestion/ heartburn.  Gastrointestinal (Lower):   Patient denies diarrhea and constipation.  Constitutional:   Patient denies fever, night sweats, weight loss, and fatigue.  Skin:   Patient denies skin rash/ lesion and itching.  Eyes:   Patient denies double vision and blurred vision.  Ears/ Nose/ Throat:   Patient denies sore throat and sinus problems.  Hematologic/Lymphatic:   Patient denies swollen glands and easy bruising.  Cardiovascular:   Patient denies leg swelling and chest pains.  Respiratory:   Patient denies cough and shortness of breath.  Endocrine:   Patient denies excessive thirst.  Musculoskeletal:   Patient denies back pain and joint pain.  Neurological:   Patient denies headaches and dizziness.  Psychologic:   Patient denies depression and anxiety.   VITAL SIGNS:      10/20/2019 11:04 AM  Weight 195 lb / 88.45 kg  Height 70 in / 177.8 cm  BP 147/76 mmHg  Pulse 71 /min  Temperature 97.5 F / 36.3 C  BMI 28.0 kg/m   MULTI-SYSTEM PHYSICAL EXAMINATION:    Constitutional: Well-nourished. No physical deformities. Normally developed. Good grooming.  Respiratory: Normal breath sounds. No labored breathing, no use of accessory muscles.   Cardiovascular: Regular rate and rhythm. No murmur, no gallop.      PAST DATA REVIEWED:  Source Of History:  Patient  Urine Test Review:   Urinalysis, Urine Culture   10/11/19 05/18/19 11/10/18 05/18/18 11/11/17 05/06/17 11/09/16 05/19/16  PSA  Total PSA 0.62 ng/dl 0.6 ng/dl 0.7 ng/dl 0.6 ng/dl 0.64 ng/dl 0.5 ng/dl 0.4 ng/dl 0.79     PROCEDURES:         Flexible  Cystoscopy - 52000  Risks, benefits, and some of the potential complications of the procedure were discussed. 21ml of 2% lidocaine jelly was instilled intraurethrally.  Cipro 500mg  given for antibiotic prophylaxis.     Meatus:  Normal size. Normal location. Normal condition.  Urethra:  No strictures.  External Sphincter:  Normal.  Verumontanum:  Normal.  Prostate:  Obstructing. Moderate hyperplasia. with inflammatory polyps vs tumor fronds in the proximal prostate  Bladder Neck:  Non-obstructing.  Ureteral Orifices:  Normal location. Normal size. Normal shape. Effluxed clear urine.  Bladder:  Moderate trabeculation. 1 cm tumor at the posterior bladder neck with bleeding and some left lateral wall erythema. No stones.      The procedure was well tolerated and there were no complications.         Urinalysis - 81003 Dipstick Dipstick Cont'd  Specimen: Voided Bilirubin: 1+  Color:  Yellow Ketones: Neg  Appearance: Cloudy Blood: 3+  Specific Gravity: 1.015 Protein: Trace  pH: 8.5 Urobilinogen: 0.2  Glucose: Neg Nitrites: Neg    Leukocyte Esterase: Neg    ASSESSMENT:      ICD-10 Details  1 GU:   Gross hematuria - R31.0 The hematuria is associated with a neoplasm at the posterior bladder neck. I am going to send a cytology and try to get his CT AP rescheduled earlier. I will get him set up for cystoscopy with TURBT, possible bil RTG's if we can't get the CT done and possible instillation of Gemcitabine. I have reviewed the risks of bleeding, infection, strictures, injury to adjacent structures, chemical cystitis, thrombotic events and anesthetic complications.   2   Bladder tumor/neoplasm - D41.4    PLAN:           Orders Labs Urine Cytology          Schedule Return Visit/Planned Activity: ASAP - Schedule Surgery

## 2019-11-09 NOTE — Interval H&P Note (Deleted)
No change in history.   Cytology was negative.  CT showed no significant lesions.

## 2019-11-09 NOTE — Discharge Instructions (Addendum)
Transurethral Resection of Bladder Tumor, Care After This sheet gives you information about how to care for yourself after your procedure. Your health care provider may also give you more specific instructions. If you have problems or questions, contact your health care provider. What can I expect after the procedure? After the procedure, it is common to have:  A small amount of blood in your urine for up to 2 weeks.  Soreness or mild pain from your catheter. After your catheter is removed, you may have mild soreness, especially when urinating.  Pain in your lower abdomen. Follow these instructions at home: Medicines   Take over-the-counter and prescription medicines only as told by your health care provider.  If you were prescribed an antibiotic medicine, take it as told by your health care provider. Do not stop taking the antibiotic even if you start to feel better.  Do not drive for 24 hours if you were given a sedative during your procedure.  Ask your health care provider if the medicine prescribed to you: ? Requires you to avoid driving or using heavy machinery. ? Can cause constipation. You may need to take these actions to prevent or treat constipation:  Take over-the-counter or prescription medicines.  Eat foods that are high in fiber, such as beans, whole grains, and fresh fruits and vegetables.  Limit foods that are high in fat and processed sugars, such as fried or sweet foods. Activity  Return to your normal activities as told by your health care provider. Ask your health care provider what activities are safe for you.  Do not lift anything that is heavier than 10 lb (4.5 kg), or the limit that you are told, until your health care provider says that it is safe.  Avoid intense physical activity for as long as told by your health care provider.  Rest as told by your health care provider.  Avoid sitting for a long time without moving. Get up to take short walks every  1-2 hours. This is important to improve blood flow and breathing. Ask for help if you feel weak or unsteady. General instructions   Do not drink alcohol for as long as told by your health care provider. This is especially important if you are taking prescription pain medicines.  Do not take baths, swim, or use a hot tub until your health care provider approves. Ask your health care provider if you may take showers. You may only be allowed to take sponge baths.  If you have a catheter, follow instructions from your health care provider about caring for your catheter and your drainage bag.  Drink enough fluid to keep your urine pale yellow.  Wear compression stockings as told by your health care provider. These stockings help to prevent blood clots and reduce swelling in your legs.  Keep all follow-up visits as told by your health care provider. This is important. ? You will need to be followed closely with regular checks of your bladder and urethra (cystoscopies) to make sure that the cancer does not come back. Contact a health care provider if:  You have pain that gets worse or does not improve with medicine.  You have blood in your urine for more than 2 weeks.  You have cloudy or bad-smelling urine.  You become constipated. Signs of constipation may include having: ? Fewer than three bowel movements in a week. ? Difficulty having a bowel movement. ? Stools that are dry, hard, or larger than normal.  You have  a fever. Get help right away if:  You have: ? Severe pain. ? Bright red blood in your urine. ? Blood clots in your urine. ? A lot of blood in your urine.  Your catheter has been removed and you are not able to urinate.  You have a catheter in place and the catheter is not draining urine. Summary  After your procedure, it is common to have a small amount of blood in your urine, soreness or mild pain from your catheter, and pain in your lower abdomen.  Take  over-the-counter and prescription medicines only as told by your health care provider.  Rest as told by your health care provider. Follow your health care provider's instructions about returning to normal activities. Ask what activities are safe for you.  If you have a catheter, follow instructions from your health care provider about caring for your catheter and your drainage bag.  Get help right away if you cannot urinate, you have severe pain, or you have bright red blood or blood clots in your urine.  You may remove the catheter in the morning by cutting off the purple nipple.  A small amount of fluid should come out and then the catheter should just slide out.  If you don't feel comfortable doing that, please call the [place] office to come have it removed.   This information is not intended to replace advice given to you by your health care provider. Make sure you discuss any questions you have with your health care provider. Document Released: 10/28/2015 Document Revised: 06/16/2018 Document Reviewed: 06/16/2018 Elsevier Patient Education  [place] Instructions  Activity: Get plenty of rest for the remainder of the day. A responsible individual must stay with you for 24 hours following the procedure.  For the next 24 hours, DO NOT: -Drive a car -Paediatric nurse -Drink alcoholic beverages -Take any medication unless instructed by your physician -Make any legal decisions or sign important papers.  Meals: Start with liquid foods such as gelatin or soup. Progress to regular foods as tolerated. Avoid greasy, spicy, heavy foods. If nausea and/or vomiting occur, drink only clear liquids until the nausea and/or vomiting subsides. Call your physician if vomiting continues.  Special Instructions/Symptoms: Your throat may feel dry or sore from the anesthesia or the breathing tube placed in your throat during surgery. If this causes discomfort,  gargle with warm salt water. The discomfort should disappear within 24 hours.  If you had a scopolamine patch placed behind your ear for the management of post- operative nausea and/or vomiting:  1. The medication in the patch is effective for 72 hours, after which it should be removed.  Wrap patch in a tissue and discard in the trash. Wash hands thoroughly with soap and water. 2. You may remove the patch earlier than 72 hours if you experience unpleasant side effects which may include dry mouth, dizziness or visual disturbances. 3. Avoid touching the patch. Wash your hands with soap and water after contact with the patch.     Indwelling Urinary Catheter Care, Adult An indwelling urinary catheter is a thin tube that is put into your bladder. The tube helps to drain pee (urine) out of your body. The tube goes in through your urethra. Your urethra is where pee comes out of your body. Your pee will come out through the catheter, then it will go into a bag (drainage bag). Take good care of your catheter so it will  work well. How to wear your catheter and bag Supplies needed  Sticky tape (adhesive tape) or a leg strap.  Alcohol wipe or soap and water (if you use tape).  A clean towel (if you use tape).  Large overnight bag.  Smaller bag (leg bag). Wearing your catheter Attach your catheter to your leg with tape or a leg strap.  Make sure the catheter is not pulled tight.  If a leg strap gets wet, take it off and put on a dry strap.  If you use tape to hold the bag on your leg: 1. Use an alcohol wipe or soap and water to wash your skin where the tape made it sticky before. 2. Use a clean towel to pat-dry that skin. 3. Use new tape to make the bag stay on your leg. Wearing your bags You should have been given a large overnight bag.  You may wear the overnight bag in the day or night.  Always have the overnight bag lower than your bladder.  Do not let the bag touch the  floor.  Before you go to sleep, put a clean plastic bag in a wastebasket. Then hang the overnight bag inside the wastebasket. You should also have a smaller leg bag that fits under your clothes.  Always wear the leg bag below your knee.  Do not wear your leg bag at night. How to care for your skin and catheter Supplies needed  A clean washcloth.  Water and mild soap.  A clean towel. Caring for your skin and catheter      Clean the skin around your catheter every day: ? Wash your hands with soap and water. ? Wet a clean washcloth in warm water and mild soap. ? Clean the skin around your urethra. ? If you are male: ? Gently spread the folds of skin around your vagina (labia). ? With the washcloth in your other hand, wipe the inner side of your labia on each side. Wipe from front to back. ? If you are male: ? Pull back any skin that covers the end of your penis (foreskin). ? With the washcloth in your other hand, wipe your penis in small circles. Start wiping at the tip of your penis, then move away from the catheter. ? Move the foreskin back in place, if needed. ? With your free hand, hold the catheter close to where it goes into your body. ? Keep holding the catheter during cleaning so it does not get pulled out. ? With the washcloth in your other hand, clean the catheter. ? Only wipe downward on the catheter. ? Do not wipe upward toward your body. Doing this may push germs into your urethra and cause infection. ? Use a clean towel to pat-dry the catheter and the skin around it. Make sure to wipe off all soap. ? Wash your hands with soap and water.  Shower every day. Do not take baths.  Do not use cream, ointment, or lotion on the area where the catheter goes into your body, unless your doctor tells you to.  Do not use powders, sprays, or lotions on your genital area.  Check your skin around the catheter every day for signs of infection. Check for: ? Redness, swelling,  or pain. ? Fluid or blood. ? Warmth. ? Pus or a bad smell. How to empty the bag Supplies needed  Rubbing alcohol.  Gauze pad or cotton ball.  Tape or a leg strap. Emptying the bag Pour the  pee out of your bag when it is ?- full, or at least 2-3 times a day. Do this for your overnight bag and your leg bag. 1. Wash your hands with soap and water. 2. Separate (detach) the bag from your leg. 3. Hold the bag over the toilet or a clean pail. Keep the bag lower than your hips and bladder. This is so the pee (urine) does not go back into the tube. 4. Open the pour spout. It is at the bottom of the bag. 5. Empty the pee into the toilet or pail. Do not let the pour spout touch any surface. 6. Put rubbing alcohol on a gauze pad or cotton ball. 7. Use the gauze pad or cotton ball to clean the pour spout. 8. Close the pour spout. 9. Attach the bag to your leg with tape or a leg strap. 10. Wash your hands with soap and water. Follow instructions for cleaning the drainage bag:  From the product maker.  As told by your doctor. How to change the bag Supplies needed  Alcohol wipes.  A clean bag.  Tape or a leg strap. Changing the bag Replace your bag when it starts to leak, smell bad, or look dirty. 1. Wash your hands with soap and water. 2. Separate the dirty bag from your leg. 3. Pinch the catheter with your fingers so that pee does not spill out. 4. Separate the catheter tube from the bag tube where these tubes connect (at the connection valve). Do not let the tubes touch any surface. 5. Clean the end of the catheter tube with an alcohol wipe. Use a different alcohol wipe to clean the end of the bag tube. 6. Connect the catheter tube to the tube of the clean bag. 7. Attach the clean bag to your leg with tape or a leg strap. Do not make the bag tight on your leg. 8. Wash your hands with soap and water. General rules   Never pull on your catheter. Never try to take it out. Doing  that can hurt you.  Always wash your hands before and after you touch your catheter or bag. Use a mild, fragrance-free soap. If you do not have soap and water, use hand sanitizer.  Always make sure there are no twists or bends (kinks) in the catheter tube.  Always make sure there are no leaks in the catheter or bag.  Drink enough fluid to keep your pee pale yellow.  Do not take baths, swim, or use a hot tub.  If you are male, wipe from front to back after you poop (have a bowel movement). Contact a doctor if:  Your pee is cloudy.  Your pee smells worse than usual.  Your catheter gets clogged.  Your catheter leaks.  Your bladder feels full. Get help right away if:  You have redness, swelling, or pain where the catheter goes into your body.  You have fluid, blood, pus, or a bad smell coming from the area where the catheter goes into your body.  Your skin feels warm where the catheter goes into your body.  You have a fever.  You have pain in your: ? Belly (abdomen). ? Legs. ? Lower back. ? Bladder.  You see blood in the catheter.  Your pee is pink or red.  You feel sick to your stomach (nauseous).  You throw up (vomit).  You have chills.  Your pee is not draining into the bag.  Your catheter gets pulled out.  Summary  An indwelling urinary catheter is a thin tube that is placed into the bladder to help drain pee (urine) out of the body.  The catheter is placed into the part of the body that drains pee from the bladder (urethra).  Taking good care of your catheter will keep it working properly and help prevent problems.  Always wash your hands before and after touching your catheter or bag.  Never pull on your catheter or try to take it out. This information is not intended to replace advice given to you by your health care provider. Make sure you discuss any questions you have with your health care provider. Document Released: 03/13/2013 Document  Revised: 03/10/2019 Document Reviewed: 07/02/2017 Elsevier Patient Education  2020 [employer].

## 2019-11-09 NOTE — Op Note (Signed)
Procedure: Cystoscopy with transurethral resection of a  bladder tumor from overlapping sites.  Preop diagnosis: Gross hematuria with bladder neck lesion.  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  Surgeon: Dr. Irine Seal.  Anesthesia: General.  Specimen: Tumor chips from the trigone and from the prostatic urethra sent separately.  Drain: 61 [place] Foley catheter.  EBL: 2 mL.  Complications: None.  Indications: Jack Mcdonald is a 63 year old white male with a history of prostate cancer treated in 2014 with radiation therapy who presented with recent onset gross hematuria and was found to have a bladder neck lesion on cystoscopy that was worrisome for a urothelial neoplasm.  CT scan demonstrated no upper tract abnormalities but some thickening at the bladder neck, left greater than right.  Procedure: He was given antibiotic.  He was taken operating room where he was given a general anesthetic.  He was placed in lithotomy position and fitted with PAS hose.  His perineum and genitalia were prepped with Betadine solution and draped in usual sterile fashion.  Cystoscopy was performed using a 23 [place] scope and 30 degree and 70 degree lenses.  Examination revealed a normal anterior urethra.  There was mild stricturing in the proximal bulb that was easily passed by the scope.  The external sphincter was intact.  The prostatic urethra was approximately 2 to 3 cm in length with bilobar hyperplasia without significant obstruction.  Examination the mucosa the prostatic urethra demonstrated papillary fronds in the floor from just proximal to the Veru to just distal to the bladder neck.  Additionally papillary fronds were noted anteriorly particularly on the right proximal prostate and bladder neck but also posteriorly extending from the left bladder neck onto the trigone.  No tumors were noted on the body of the bladder wall.  Ureteral orifices were  unremarkable although the left ureteral orifice was close to the tumor fronds in the medial trigone.  The cystoscope was removed and was replaced with a continuous-flow resectoscope sheath the aid of the visual obturator.  The scope was then fitted with an Beatrix Fetters handle with a 30 degree lens and bipolar loop.  Saline was used as the irrigant.  The trigone tumor area was resected with care being taken to avoid ureteral orifice but there was some minor cautery close to the orifice however it is not felt to be sufficiently severe to require stenting.  An additional small lesion was noted 2 to 3 cm lateral to the orifice that could have been just radiation neovascularity but it was fulgurated.  Erythematous mucosa was noted on the left bladder neck extending posteriorly and anteriorly and into the proximal left prostate this was cauterized with a few tumor fronds being resected as well.  The largest of the tumor fronds was at the right anterior proximal prostate and this was resected.  Hemostasis was achieved and these initial chips were retrieved to be sent to pathology.  I then resected the floor the prostate to remove the tumor fronds that were present from just proximal to the Vero to just distal to the bladder neck and in the mid prostate that extended slightly up onto the lateral lobes.  These fronds were completely resected and the specimen retrieved to be sent as a separate specimen.  The resection bed was then generously fulgurated.  Final inspection revealed no active bleeding and no retained chips.  The left ureteral orifice was inspected once again and I did not feel stenting was indicated.  Because of the presence of the prostatic involvement I did not feel that gemcitabine was appropriate.  The resectoscope was removed and a 27 French Foley catheter was placed with the aid of a catheter guide.  The balloon was filled with 10 mL of sterile fluid.  The catheter was irrigated with clear return and placed  to straight drainage.  He was taken down from lithotomy position, his anesthetic was reversed and he was moved recovery in stable condition.  There were no complications.

## 2019-11-12 LAB — SURGICAL PATHOLOGY

## 2019-11-17 ENCOUNTER — Ambulatory Visit (INDEPENDENT_AMBULATORY_CARE_PROVIDER_SITE_OTHER): Payer: Medicare Other | Admitting: Urology

## 2019-11-17 ENCOUNTER — Encounter: Payer: Self-pay | Admitting: Urology

## 2019-11-17 DIAGNOSIS — C678 Malignant neoplasm of overlapping sites of bladder: Secondary | ICD-10-CM | POA: Diagnosis not present

## 2019-11-17 DIAGNOSIS — Z8546 Personal history of malignant neoplasm of prostate: Secondary | ICD-10-CM | POA: Diagnosis not present

## 2019-11-17 DIAGNOSIS — N138 Other obstructive and reflux uropathy: Secondary | ICD-10-CM | POA: Insufficient documentation

## 2019-11-17 DIAGNOSIS — N401 Enlarged prostate with lower urinary tract symptoms: Secondary | ICD-10-CM

## 2019-11-17 LAB — POCT URINALYSIS DIPSTICK
Bilirubin, UA: NEGATIVE
Blood, UA: 2
Glucose, UA: NEGATIVE
Ketones, UA: NEGATIVE
Leukocytes, UA: NEGATIVE
Nitrite, UA: NEGATIVE
Protein, UA: NEGATIVE
Spec Grav, UA: >=1.03 — AB (ref 1.010–1.025)
Urobilinogen, UA: NEGATIVE E.U./dL — AB
pH, UA: 6 (ref 5.0–8.0)

## 2019-11-17 MED ORDER — TAMSULOSIN HCL 0.4 MG PO CAPS: 0.4000 mg | ORAL_CAPSULE | Freq: Every day | ORAL | 3 refills | Status: DC

## 2019-11-17 NOTE — Assessment & Plan Note (Signed)
history of T2 Gleason 7 prostate cancer treated with proton beam in 7/14. His PSA stable at 0.62 on 10/11/19 It nadired at 0.4 in 12/17.

## 2019-11-17 NOTE — Assessment & Plan Note (Addendum)
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   I will have him return to begin BCG weekly x 6 weeks.  I reviewed the side effects.

## 2019-11-17 NOTE — Progress Notes (Signed)
Subjective:  1. History of prostate cancer   2. Malignant neoplasm of overlapping sites of bladder ([place])   3. Benign prostatic hyperplasia with urinary obstruction      Jack Mcdonald returns today in f/u from a TURBT on 11/09/19.   He was found to have LG NMIBC at the bladder neck and in the prostatic urethra.   He has a history of prostate cancer that was treated with proton beam therapy in July of 2014 and his PSA has been stable with the most recent 0.62 and the prior nadir of 0.42.    He is having some frequency and urgency with dysuria since the procedure.  He has no hematuria.   IPSS    Row Name 11/17/19 1500         International Prostate Symptom Score   How often have you had the sensation of not emptying your bladder?  Less than 1 in 5     How often have you had to urinate less than every two hours?  Almost always     How often have you found you stopped and started again several times when you urinated?  Not at All     How often have you found it difficult to postpone urination?  About half the time     How often have you had a weak urinary stream?  About half the time     How often have you had to strain to start urination?  Not at All     How many times did you typically get up at night to urinate?  2 Times     Total IPSS Score  14       Quality of Life due to urinary symptoms   If you were to spend the rest of your life with your urinary condition just the way it is now how would you feel about that?  Mixed         ROS:  Review of Systems  All other systems reviewed and are negative.   No Known Allergies  Outpatient Encounter Medications as of 11/17/2019  Medication Sig  . hydrochlorothiazide (MICROZIDE) 12.5 MG capsule Take 12.5 mg by mouth daily.  . metoprolol tartrate (LOPRESSOR) 25 MG tablet Take 12.5 mg by mouth 2 (two) times daily.  . tamsulosin (FLOMAX) 0.4 MG CAPS capsule Take 1 capsule (0.4 mg total) by mouth daily.  [name] telmisartan (MICARDIS) 40 MG tablet Take  40 mg by mouth daily.  . [DISCONTINUED] tamsulosin (FLOMAX) 0.4 MG CAPS capsule Take 0.4 mg by mouth.  . [DISCONTINUED] hydrochlorothiazide (HYDRODIURIL) 12.5 MG tablet Take 12.5 mg by mouth daily.   Facility-Administered Encounter Medications as of 11/17/2019  Medication  . 0.9 %  sodium chloride infusion  . gemcitabine (GEMZAR) chemo syringe for bladder instillation 2,000 mg    Past Medical History:  Diagnosis Date  . Aortic regurgitation   . BPH with urinary obstruction   . Cataract    starting  . Chronic kidney disease    neproliathiasis of right kidney  . Dysphagia   . Dysrhythmia    extra heart beat at times  . GERD (gastroesophageal reflux disease)   . Hematospermia   . History of kidney stones   . Hypertension   . Moderate aortic regurgitation    per 06-24-19 echo dr Lennette Bihari [facility]  . Nocturia   . Prostate cancer ([place]) 01/04/13   Adenocarcinoma,gleason=3+4=7,& 3+3=6,PSA=3.94,Volume=63cc  . Radiation    47  treatment radiation-for prostate-2014    Past Surgical History:  Procedure Laterality Date  . COLONOSCOPY  age 24   few polyps  . PROSTATE BIOPSY  01/04/13   Adenocarncinoma  . TRANSURETHRAL RESECTION OF BLADDER TUMOR WITH MITOMYCIN-C Bilateral 11/09/2019   Procedure: TRANSURETHRAL RESECTION OF BLADDER TUMOR;  Surgeon: Irine Seal, MD;  Location: [facility];  Service: Urology;  Laterality: Bilateral;    Social History   Socioeconomic History  . Marital status: Married    Spouse name: Not on file  . Number of children: 1  . Years of education: Not on file  . Highest education level: Not on file  Occupational History  . Occupation: Painter  Tobacco Use  . Smoking status: Never Smoker  . Smokeless tobacco: Never Used  Substance and Sexual Activity  . Alcohol use: Yes    Comment: 2 per day mixed drinks  . Drug use: No  . Sexual activity: Yes  Other Topics Concern  . Not on file  Social History  Narrative  . Not on file   Social Determinants of Health   Financial Resource Strain:   . Difficulty of Paying Living Expenses: Not on file  Food Insecurity:   . Worried About Charity fundraiser in the Last Year: Not on file  . Ran Out of Food in the Last Year: Not on file  Transportation Needs:   . Lack of Transportation (Medical): Not on file  . Lack of Transportation (Non-Medical): Not on file  Physical Activity:   . Days of Exercise per Week: Not on file  . Minutes of Exercise per Session: Not on file  Stress:   . Feeling of Stress : Not on file  Social Connections:   . Frequency of Communication with Friends and Family: Not on file  . Frequency of Social Gatherings with Friends and Family: Not on file  . Attends Religious Services: Not on file  . Active Member of Clubs or Organizations: Not on file  . Attends Archivist Meetings: Not on file  . Marital Status: Not on file  Intimate Partner Violence:   . Fear of Current or Ex-Partner: Not on file  . Emotionally Abused: Not on file  . Physically Abused: Not on file  . Sexually Abused: Not on file    Family History  Problem Relation Age of Onset  . Colon cancer Neg Hx   . Colon polyps Neg Hx   . Rectal cancer Neg Hx   . Stomach cancer Neg Hx        Objective: Vitals:   11/17/19 1442 11/17/19 1450  BP:  (!) 144/80  Pulse:  83  Temp: (!) 95.5 F (35.3 C) (!) 95.5 F (35.3 C)     Physical Exam Vitals reviewed.  Constitutional:      Appearance: Normal appearance.  Neurological:     Mental Status: He is alert.     Lab Results:  UA has 2+ blood.  BMET No results for input(s): NA, K, CL, CO2, GLUCOSE, BUN, CREATININE, CALCIUM in the last 72 hours. PSA      Studies/Results: Pathology  FINAL MICROSCOPIC DIAGNOSIS:   A. 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 PROSTATIC URETHRA TUMOR, TRANSURETHRAL RESECTION:  Early Noninvasive low grade papillary urothelial carcinoma FINAL MICROSCOPIC DIAGNOSIS:    Assessment & Plan: Malignant neoplasm of overlapping sites of bladder ([place])  Jack Mcdonald was found to have LG NMIBC involving the bladder neck and prostatic urethra on a TURBT on 11/09/19.   I will have him return to begin BCG weekly x 6 weeks.  I reviewed the side effects.   History of prostate cancer history of T2 Gleason 7 prostate cancer treated with proton beam in 7/14. His PSA stable at 0.62 on 10/11/19 It nadired at 0.4 in 12/17.    Benign prostatic hyperplasia with urinary obstruction Tamsulosin refilled.      Orders Placed This Encounter  Procedures  . POCT urinalysis dipstick    Return in about 3 weeks (around 12/08/2019).   CC: Dr. Carrolyn Leigh.      Irine Seal 11/17/2019

## 2019-11-17 NOTE — Assessment & Plan Note (Signed)
Tamsulosin refilled.

## 2019-11-17 NOTE — Patient Instructions (Signed)
Patient Education: (BCG) Into the Bladder (Intravesical Chemotherapy)  BCG is a vaccine which is used to prevent tuberculosis (TB). But it's also a helpful treatment for some early bladder cancers. When BCG goes directly into the bladder the treatment is described as intravesical. BCG is a type of immunotherapy. Immunotherapy stimulates the body's immune system to destroy cancer cells.  How it's given BCG treatment is given to you in an outpatient setting. It takes a few minutes to administer and you can go home as soon as it's finished. It might be a good idea to ask someone to bring you, particularly the first time.  Unlike chemotherapy into the bladder, BCG treatment is never given immediately after surgery to remove bladder tumors. There needs to be a delay usually of at least two weeks after surgery, before you can have it. You won't be given treatment with BCG if you are unwell or have an infection in your urine.  You're usually asked to limit the amount you drink before your treatment. This will help to increase the concentration of BCG in your bladder. Drinking too much before your treatment may make your bladder feel uncomfortably full. If you normally take water tablets (diuretics) take them later in the day after your treatment. Your nurse or doctor will give you more advice about preparing for your treatment.  You will have a small tube (catheter) placed into your bladder. Your doctor will then put the liquid vaccine directly into your bladder through the catheter and remove the catheter.   You will need to hold your urine for two hours afterwards. This can be difficult but it's to give the treatment time to work. You can walk around during this time. When the treatment is over you can go to the toilet.  After your treatment there are some precautions you'll need to take. This is because BCG is a live vaccine and other people shouldn't be exposed to it.  For the next six hours, you'll  need to avoid your urine splashing on the toilet seat and getting any urine on your hands. It might be easier for men to sit down when they're using an ordinary toilet although using a stand up urinal should be alright. The main thing is to avoid splashing urine and spreading the vaccine. You will also be asked to put 1/2 cup undiluted bleach into the toilet bowl to destroy any live vaccine and leave it for 15 minutes until you flush.   Side effects Because BCG goes directly into the bladder most of the side effects are linked with the bladder. They usually go away within one to two days after your treatment. The most common ones are:    Needing to pass urine often   Pain when you pass urine   Blood in the urine   Flu-like symptoms (tiredness, general aching and a raised temperature)  These side effects should settle down within a day or two. If they don't get better contact your doctor. Drinking lots of fluids can help flush the drug out of your bladder and reduce some of these effects. Taking Ibuprofen or Alleve is encouraged unless you have a condition that would make these medications unsafe to take (e.g. renal failure, diabetes, GERD, etc).  Rare side effects can include a continuing high temperature (fever), pain in your joints and a cough. If you have any of these symptoms, or if you feel generally unwell, contact your doctor immediately. These symptoms could be a sign of a  more serious infection (due to BCG) that needs to be treated immediately. If this happens you'll be treated with the same drugs (antibiotics) that are used to treat TB.  Contraception Men should use a condom during sex for the first 48 hours after their treatment. If you are a woman who has had BCG treatment then your partner should use a condom. Using a condom will protect your partner from any vaccine present in your semen or vaginal fluid.  We don't know how BCG may affect a developing fetus so it's not advisable  to become pregnant or father a child while having it. It is important to use effective contraception during your treatment and for six weeks afterwards. You can discuss this with your doctor or specialist nurse.  Adapted from [employer] cancer support and the Oncology Nursing Society cancer chemotherapy guidelines. Reviewed by: Rudi Heap MD Date: 09/26/2009 Approved by: [facility] Cancer Committee Date: 05/01/2009

## 2019-12-08 ENCOUNTER — Ambulatory Visit: Payer: PRIVATE HEALTH INSURANCE

## 2019-12-15 ENCOUNTER — Other Ambulatory Visit (HOSPITAL_COMMUNITY)
Admission: AD | Admit: 2019-12-15 | Discharge: 2019-12-15 | Disposition: A | Discharge status: Home / Self Care | Payer: Medicare Other | Source: Ambulatory Visit | Attending: Urology | Admitting: Urology

## 2019-12-15 ENCOUNTER — Other Ambulatory Visit: Payer: Self-pay

## 2019-12-15 ENCOUNTER — Ambulatory Visit (INDEPENDENT_AMBULATORY_CARE_PROVIDER_SITE_OTHER): Payer: Medicare Other

## 2019-12-15 VITALS — Temp 97.0°F

## 2019-12-15 DIAGNOSIS — N138 Other obstructive and reflux uropathy: Secondary | ICD-10-CM

## 2019-12-15 DIAGNOSIS — N390 Urinary tract infection, site not specified: Secondary | ICD-10-CM | POA: Insufficient documentation

## 2019-12-15 DIAGNOSIS — C678 Malignant neoplasm of overlapping sites of bladder: Secondary | ICD-10-CM

## 2019-12-15 LAB — POCT URINALYSIS DIPSTICK
Bilirubin, UA: NEGATIVE
Glucose, UA: NEGATIVE
Ketones, UA: NEGATIVE
Nitrite, UA: POSITIVE
Spec Grav, UA: 1.015 (ref 1.010–1.025)
Urobilinogen, UA: NEGATIVE E.U./dL — AB
pH, UA: 6.5 (ref 5.0–8.0)

## 2019-12-15 MED ORDER — BCG LIVE 50 MG IS SUSR: 3.2400 mL | Freq: Once | INTRAVESICAL | Status: DC

## 2019-12-15 MED ORDER — TAMSULOSIN HCL 0.4 MG PO CAPS: 0.4000 mg | ORAL_CAPSULE | Freq: Every day | ORAL | 3 refills | Status: DC

## 2019-12-15 NOTE — Addendum Note (Signed)
Addended byIris Pert on: 12/15/2019 02:16 PM   Modules accepted: Orders

## 2019-12-15 NOTE — Progress Notes (Signed)
Pt. Unable to receive bcg today due to possible infection.

## 2019-12-17 LAB — URINE CULTURE: Culture: NO GROWTH

## 2019-12-19 ENCOUNTER — Telehealth: Payer: Self-pay

## 2019-12-19 NOTE — Telephone Encounter (Signed)
Pt. Notified. And told to return at next scheduled appt.

## 2019-12-19 NOTE — Telephone Encounter (Signed)
-----   Message from Irine Seal, MD sent at 12/19/2019  8:57 AM EST ----- I don't recall ever seeing that result, but if it is negative, he can start  the BCG.     ----- Message ----- From: Valentina Lucks, LPN Sent: [id number]   8:51 AM EST To: Irine Seal, MD  Pt. Called this morning saying he was waiting for med for infection. I see the result of cx as no growth. Pls. Advise.

## 2019-12-22 ENCOUNTER — Other Ambulatory Visit: Payer: Self-pay

## 2019-12-22 ENCOUNTER — Other Ambulatory Visit (HOSPITAL_COMMUNITY)
Admission: RE | Admit: 2019-12-22 | Discharge: 2019-12-22 | Disposition: A | Discharge status: Home / Self Care | Payer: Medicare Other | Source: Other Acute Inpatient Hospital | Attending: Urology | Admitting: Urology

## 2019-12-22 ENCOUNTER — Ambulatory Visit (INDEPENDENT_AMBULATORY_CARE_PROVIDER_SITE_OTHER): Payer: Medicare Other

## 2019-12-22 VITALS — Temp 96.1°F

## 2019-12-22 DIAGNOSIS — C678 Malignant neoplasm of overlapping sites of bladder: Secondary | ICD-10-CM | POA: Diagnosis not present

## 2019-12-22 DIAGNOSIS — N390 Urinary tract infection, site not specified: Secondary | ICD-10-CM | POA: Insufficient documentation

## 2019-12-22 LAB — POCT URINALYSIS DIPSTICK
Bilirubin, UA: NEGATIVE
Glucose, UA: NEGATIVE
Ketones, UA: NEGATIVE
Nitrite, UA: POSITIVE
Protein, UA: POSITIVE — AB
Spec Grav, UA: 1.015 (ref 1.010–1.025)
Urobilinogen, UA: NEGATIVE E.U./dL — AB
pH, UA: 7 (ref 5.0–8.0)

## 2019-12-22 NOTE — Progress Notes (Signed)
Unable to start bcg tx today due to ua. Urine sent for culture. Pt. rescheduled for 2 week OV.

## 2019-12-23 LAB — URINE CULTURE: Culture: NO GROWTH

## 2019-12-29 ENCOUNTER — Ambulatory Visit: Payer: PRIVATE HEALTH INSURANCE

## 2020-01-03 ENCOUNTER — Other Ambulatory Visit: Payer: Self-pay

## 2020-01-05 ENCOUNTER — Other Ambulatory Visit: Payer: Self-pay

## 2020-01-05 ENCOUNTER — Ambulatory Visit (INDEPENDENT_AMBULATORY_CARE_PROVIDER_SITE_OTHER): Payer: Medicare Other | Admitting: Urology

## 2020-01-05 ENCOUNTER — Inpatient Hospital Stay: Admit: 2020-01-05 | Payer: PRIVATE HEALTH INSURANCE

## 2020-01-05 ENCOUNTER — Encounter (HOSPITAL_BASED_OUTPATIENT_CLINIC_OR_DEPARTMENT_OTHER): Payer: Self-pay | Admitting: Urology

## 2020-01-05 ENCOUNTER — Other Ambulatory Visit: Payer: Self-pay | Admitting: Urology

## 2020-01-05 ENCOUNTER — Other Ambulatory Visit (HOSPITAL_COMMUNITY)
Admission: AD | Admit: 2020-01-05 | Discharge: 2020-01-05 | Disposition: A | Discharge status: Home / Self Care | Payer: Medicare Other | Source: Other Acute Inpatient Hospital | Attending: Urology | Admitting: Urology

## 2020-01-05 VITALS — BP 131/76 | HR 70 | Temp 96.8°F | Ht 70.0 in | Wt 195.0 lb

## 2020-01-05 DIAGNOSIS — C678 Malignant neoplasm of overlapping sites of bladder: Secondary | ICD-10-CM | POA: Diagnosis not present

## 2020-01-05 DIAGNOSIS — N390 Urinary tract infection, site not specified: Secondary | ICD-10-CM | POA: Diagnosis not present

## 2020-01-05 DIAGNOSIS — R300 Dysuria: Secondary | ICD-10-CM | POA: Insufficient documentation

## 2020-01-05 DIAGNOSIS — N99112 Postprocedural membranous urethral stricture: Secondary | ICD-10-CM | POA: Insufficient documentation

## 2020-01-05 DIAGNOSIS — N211 Calculus in urethra: Secondary | ICD-10-CM

## 2020-01-05 LAB — POCT URINALYSIS DIPSTICK
Bilirubin, UA: NEGATIVE
Glucose, UA: NEGATIVE
Ketones, UA: NEGATIVE
Nitrite, UA: POSITIVE
Protein, UA: POSITIVE — AB
Spec Grav, UA: 1.015 (ref 1.010–1.025)
Urobilinogen, UA: NEGATIVE E.U./dL — AB
pH, UA: 7.5 (ref 5.0–8.0)

## 2020-01-05 MED ORDER — CIPROFLOXACIN HCL 500 MG PO TABS
500.0000 mg | ORAL_TABLET | Freq: Once | ORAL | Status: AC
  Administered 2020-01-05: 11:00:00 500 mg via ORAL

## 2020-01-05 NOTE — Progress Notes (Signed)
Anesthesia Chart Review   Case: [id number] Date/Time: 01/11/20 0845   Procedures:      CYSTOSCOPY WITH URETHRAL DILATATION (N/A )     TRANSURETHRAL RESECTION REMOVAL OF STONES (N/A )   Anesthesia type: General   Pre-op diagnosis: URETHRAL STRICTURE AND PROSTATE CALCIFICATION   Location: [facility] ROOM 2 / [place]   Surgeons: Irine Seal, MD      DISCUSSION:64 y.o. never smoker with h/o GERD, HTN, mild-moderate aortic regurgitation, prostate cancer, urethral stricture and prostate calcification scheduled for above procedure 01/11/20 with Dr. Irine Seal.   Last seen by cardiologist, Dr. Carrolyn Leigh, 09/21/2018.  H/o Aortic valve insufficiency, Echo 12/30/2016 EF 60%, dilated aortic root, mild-mod IA, mild MR/TR. Stress test 01/28/2017 low risk.   S/p transurethral resection of bladder tumor 11/09/2019 with no anesthesia complications.  VS: Ht 5\' 10"  (1.778 m)   Wt 88.5 kg   BMI 27.99 kg/m   PROVIDERS: Macon Large, MD is PCP   LABS: labs dos (all labs ordered are listed, but only abnormal results are displayed)  Labs Reviewed - No data to display   IMAGES:   EKG: 01/09/2019 Rate 72 bpm  NSR  CV:  Past Medical History:  Diagnosis Date  . BPH with urinary obstruction    urologist--- dr Jeffie Pollock  . Dysphagia    01-05-2020  per pt states chews well and small bites  . GERD (gastroesophageal reflux disease)   . History of external beam radiation therapy    prostate cancer--- completed 2014  . History of kidney stones   . History of prostate cancer urologist--- dr Jeffie Pollock   dx 02-21-2013   Stage T2a, Gleason 3+4;   completed radiation  . Hypertension    followed by cardiology-- Dr Carrolyn Leigh ([place])   per cardiololgy note pt had ETT 03/ 2018 negative for ishcemia  . Moderate aortic regurgitation    per 06-24-19 echo dr Lennette Bihari [facility]le [facility]  (scanned in media in epic)  . Nocturia   . Palpitations    followed  by cardiologist--- dr Raliegh Ip. Gibson Ramp;  per dr Gibson Ramp note , holter monior 01/ 2018 infrequent supraventricular and ventricular ectopic activity  . Prostate calculus   . Prostate cancer ([place]) 01/04/13   Adenocarcinoma,gleason=3+4=7,& 3+3=6,PSA=3.94,Volume=63cc  . Urethral stricture   . Wears glasses     Past Surgical History:  Procedure Laterality Date  . COLONOSCOPY  age 2   few polyps  . PROSTATE BIOPSY  01/04/13   Adenocarncinoma  . TRANSURETHRAL RESECTION OF BLADDER TUMOR WITH MITOMYCIN-C Bilateral 11/09/2019   Procedure: TRANSURETHRAL RESECTION OF BLADDER TUMOR;  Surgeon: Irine Seal, MD;  Location: [facility];  Service: Urology;  Laterality: Bilateral;    MEDICATIONS: No current facility-administered medications for this encounter.   . hydrochlorothiazide (MICROZIDE) 12.5 MG capsule  . metoprolol tartrate (LOPRESSOR) 25 MG tablet  . tamsulosin (FLOMAX) 0.4 MG CAPS capsule  . telmisartan (MICARDIS) 40 MG tablet   . gemcitabine (GEMZAR) chemo syringe for bladder instillation 2,000 mg   Maia Plan [facility] Pre-Surgical Testing [phone] 01/05/20  3:08 PM

## 2020-01-05 NOTE — Addendum Note (Signed)
Addended by: Valentina Lucks on: 01/05/2020 10:37 AM   Modules accepted: Orders

## 2020-01-05 NOTE — Progress Notes (Addendum)
Spoke w/ via phone for pre-op interview--- PT Lab needs dos---- Istat 8              Lab results------ Current ekg in chart/ epic (11-09-2019) COVID test ------ 01-08-2020 @ 0900 @ [facility] Arrive at ------- 0700 NPO after ------ MN Medications to take morning of surgery ----- Lopressor w/ sips of water Diabetic medication ----- n/a Patient Special Instructions -----  n/a Pre-Op special Istructions ----- n/a Patient verbalized understanding of instructions that were given at this phone interview. Patient denies shortness of breath, chest pain, fever, cough a this phone interview.   Anesthesia Review:   Hx HTN, Moderate AR without stenosis.  PCP:  Dr Dossie Der Admed Cardiologist :  Dr Carrolyn Leigh ([facility] 09-21-2018, echo, ekg;  Scanned in media with last surgery 11-09-2019) Chest x-ray : no EKG :  11-19-2019 epic Echo :  06-24-2019 scanned in media and chart Cardiac Cath :  No Stress test:  ETT 03/ 2018 (in cardiology lov note , negative for ischemia) Sleep Study/ CPAP : NO Fasting Blood Sugar :   / Checks Blood Sugar -- times a day:   N/a Blood Thinner/ Instructions /Last Dose:  NO ASA / Instructions/ Last Dose :  NO

## 2020-01-05 NOTE — Progress Notes (Signed)
Subjective:  1. Postprocedural membranous urethral stricture, male   2. Urinary tract infection without hematuria, site unspecified   3. 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 Dysuria   5. Urethral stone      Diarra returns today in f/u.  He was to initiate BCG but has persistent pyuria and hematuria with dysuria.  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  He has had several negative cultures.  He has had prior proton beam XRT for prostate cancer.      ROS:  ROS:  A complete review of systems was performed.  All systems are negative except for pertinent findings as noted.   Review of Systems  Genitourinary: Positive for dysuria, frequency and urgency.  All other systems reviewed and are negative.   No Known Allergies  Outpatient Encounter Medications as of 01/05/2020  Medication Sig  . hydrochlorothiazide (MICROZIDE) 12.5 MG capsule Take 12.5 mg by mouth daily.  . metoprolol tartrate (LOPRESSOR) 25 MG tablet Take 12.5 mg by mouth 2 (two) times daily.  . tamsulosin (FLOMAX) 0.4 MG CAPS capsule Take 1 capsule (0.4 mg total) by mouth daily.  [name] telmisartan (MICARDIS) 40 MG tablet Take 40 mg by mouth daily.   Facility-Administered Encounter Medications as of 01/05/2020  Medication  . 0.9 %  sodium chloride infusion  . ciprofloxacin (CIPRO) tablet 500 mg  . gemcitabine (GEMZAR) chemo syringe for bladder instillation 2,000 mg    Past Medical History:  Diagnosis Date  . Aortic regurgitation   . BPH with urinary obstruction   . Cataract    starting  . Chronic kidney disease    neproliathiasis of right kidney  . Dysphagia   . Dysrhythmia    extra heart beat at times  . GERD (gastroesophageal reflux disease)   . Hematospermia   . History of kidney stones   . Hypertension   . Moderate aortic regurgitation    per 06-24-19 echo dr Lennette Bihari [facility]   . Nocturia   . Prostate cancer ([place]) 01/04/13   Adenocarcinoma,gleason=3+4=7,& 3+3=6,PSA=3.94,Volume=63cc  . Radiation    47 treatment radiation-for prostate-2014    Past Surgical History:  Procedure Laterality Date  . COLONOSCOPY  age 37   few polyps  . PROSTATE BIOPSY  01/04/13   Adenocarncinoma  . TRANSURETHRAL RESECTION OF BLADDER TUMOR WITH MITOMYCIN-C Bilateral 11/09/2019   Procedure: TRANSURETHRAL RESECTION OF BLADDER TUMOR;  Surgeon: Irine Seal, MD;  Location: [facility];  Service: Urology;  Laterality: Bilateral;    Social History   Socioeconomic History  . Marital status: Married    Spouse name: Not on file  . Number of children: 1  . Years of education: Not on file  . Highest education level: Not on file  Occupational History  . Occupation: Painter  Tobacco Use  . Smoking status: Never Smoker  . Smokeless tobacco: Never Used  Substance and Sexual Activity  . Alcohol use: Yes    Comment: 2 per day mixed drinks  . Drug use: No  . Sexual activity: Yes  Other Topics Concern  . Not on file  Social History Narrative  . Not on file   Social Determinants of Health   Financial Resource Strain:   . Difficulty of Paying Living Expenses: Not on file  Food Insecurity:   . Worried About Charity fundraiser in the Last Year: Not on  file  . [place] in the Last Year: Not on file  Transportation Needs:   . Lack of Transportation (Medical): Not on file  . Lack of Transportation (Non-Medical): Not on file  Physical Activity:   . Days of Exercise per Week: Not on file  . Minutes of Exercise per Session: Not on file  Stress:   . Feeling of Stress : Not on file  Social Connections:   . Frequency of Communication with Friends and Family: Not on file  . Frequency of Social Gatherings with Friends and Family: Not on file  . Attends Religious Services: Not on file  . Active Member of Clubs or Organizations: Not on file  . Attends Archivist  Meetings: Not on file  . Marital Status: Not on file  Intimate Partner Violence:   . Fear of Current or Ex-Partner: Not on file  . Emotionally Abused: Not on file  . Physically Abused: Not on file  . Sexually Abused: Not on file    Family History  Problem Relation Age of Onset  . Colon cancer Neg Hx   . Colon polyps Neg Hx   . Rectal cancer Neg Hx   . Stomach cancer Neg Hx        Objective: Vitals:   01/05/20 0932  BP: 131/76  Pulse: 70  Temp: (!) 96.8 F (36 C)     Physical Exam Constitutional:      Appearance: Normal appearance.  Cardiovascular:     Rate and Rhythm: Normal rate and regular rhythm.  Pulmonary:     Effort: Pulmonary effort is normal. No respiratory distress.     Breath sounds: Normal breath sounds.  Neurological:     Mental Status: He is alert.     Lab Results:  No results found for this or any previous visit (from the past 24 hour(s)).  BMET No results for input(s): NA, K, CL, CO2, GLUCOSE, BUN, CREATININE, CALCIUM in the last 72 hours. PSA No results found for: PSA No results found for: TESTOSTERONE    Studies/Results: Cystoscopy.     He was prepped with betadine and 2% lidocaine was instilled.     The flexible scope was passed and he was found to have a normal urethra but a membranous stricture that wouldn't admit the scope.  I could see calcifications proximal to the stricture.  The bladder could not be entered.  He was given Cipro 500mg  po.  There were no complications.       Assessment & Plan:  He has a postop membranous stricture with dystrophic calcifications in the prostatic urethra likely secondary to an altered healing process in a radiated prostate.  .  I am going to schedule him for outpatient cystoscopy  With dilation of the stricture and TUR removal of the calcifications.  I have reviewed the risks of bleeding, infection, recurrent strictures, need for CIC, injury to adjacent structures, thrombotic events and anesthetic  complications.    I will also reassess the status of his bladder cancer at cystoscopy.   Meds ordered this encounter  Medications  . ciprofloxacin (CIPRO) tablet 500 mg     Orders Placed This Encounter  Procedures  . Urine Culture    Standing Status:   Future    Standing Expiration Date:   02/02/2020      Return in about 4 days (around 01/09/2020) for Outpatient cystoscopy for the stricture and stones. , Next available.   CC: System, Pcp Not In  Irine Seal 01/05/2020

## 2020-01-05 NOTE — H&P (View-Only) (Signed)
Subjective:  1. Postprocedural membranous urethral stricture, male   2. Urinary tract infection without hematuria, site unspecified   3. 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 Dysuria   5. Urethral stone      Jack Mcdonald returns today in f/u.  He was to initiate BCG but has persistent pyuria and hematuria with dysuria.  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  He has had several negative cultures.  He has had prior proton beam XRT for prostate cancer.      ROS:  ROS:  A complete review of systems was performed.  All systems are negative except for pertinent findings as noted.   Review of Systems  Genitourinary: Positive for dysuria, frequency and urgency.  All other systems reviewed and are negative.   No Known Allergies  Outpatient Encounter Medications as of 01/05/2020  Medication Sig  . hydrochlorothiazide (MICROZIDE) 12.5 MG capsule Take 12.5 mg by mouth daily.  . metoprolol tartrate (LOPRESSOR) 25 MG tablet Take 12.5 mg by mouth 2 (two) times daily.  . tamsulosin (FLOMAX) 0.4 MG CAPS capsule Take 1 capsule (0.4 mg total) by mouth daily.  [name] telmisartan (MICARDIS) 40 MG tablet Take 40 mg by mouth daily.   Facility-Administered Encounter Medications as of 01/05/2020  Medication  . 0.9 %  sodium chloride infusion  . ciprofloxacin (CIPRO) tablet 500 mg  . gemcitabine (GEMZAR) chemo syringe for bladder instillation 2,000 mg    Past Medical History:  Diagnosis Date  . Aortic regurgitation   . BPH with urinary obstruction   . Cataract    starting  . Chronic kidney disease    neproliathiasis of right kidney  . Dysphagia   . Dysrhythmia    extra heart beat at times  . GERD (gastroesophageal reflux disease)   . Hematospermia   . History of kidney stones   . Hypertension   . Moderate aortic regurgitation    per 06-24-19 echo dr Lennette Bihari [facility]   . Nocturia   . Prostate cancer ([place]) 01/04/13   Adenocarcinoma,gleason=3+4=7,& 3+3=6,PSA=3.94,Volume=63cc  . Radiation    49 treatment radiation-for prostate-2014    Past Surgical History:  Procedure Laterality Date  . COLONOSCOPY  age 17   few polyps  . PROSTATE BIOPSY  01/04/13   Adenocarncinoma  . TRANSURETHRAL RESECTION OF BLADDER TUMOR WITH MITOMYCIN-C Bilateral 11/09/2019   Procedure: TRANSURETHRAL RESECTION OF BLADDER TUMOR;  Surgeon: Irine Seal, MD;  Location: [facility];  Service: Urology;  Laterality: Bilateral;    Social History   Socioeconomic History  . Marital status: Married    Spouse name: Not on file  . Number of children: 1  . Years of education: Not on file  . Highest education level: Not on file  Occupational History  . Occupation: Painter  Tobacco Use  . Smoking status: Never Smoker  . Smokeless tobacco: Never Used  Substance and Sexual Activity  . Alcohol use: Yes    Comment: 2 per day mixed drinks  . Drug use: No  . Sexual activity: Yes  Other Topics Concern  . Not on file  Social History Narrative  . Not on file   Social Determinants of Health   Financial Resource Strain:   . Difficulty of Paying Living Expenses: Not on file  Food Insecurity:   . Worried About Charity fundraiser in the Last Year: Not on  file  . [place] in the Last Year: Not on file  Transportation Needs:   . Lack of Transportation (Medical): Not on file  . Lack of Transportation (Non-Medical): Not on file  Physical Activity:   . Days of Exercise per Week: Not on file  . Minutes of Exercise per Session: Not on file  Stress:   . Feeling of Stress : Not on file  Social Connections:   . Frequency of Communication with Friends and Family: Not on file  . Frequency of Social Gatherings with Friends and Family: Not on file  . Attends Religious Services: Not on file  . Active Member of Clubs or Organizations: Not on file  . Attends Archivist  Meetings: Not on file  . Marital Status: Not on file  Intimate Partner Violence:   . Fear of Current or Ex-Partner: Not on file  . Emotionally Abused: Not on file  . Physically Abused: Not on file  . Sexually Abused: Not on file    Family History  Problem Relation Age of Onset  . Colon cancer Neg Hx   . Colon polyps Neg Hx   . Rectal cancer Neg Hx   . Stomach cancer Neg Hx        Objective: Vitals:   01/05/20 0932  BP: 131/76  Pulse: 70  Temp: (!) 96.8 F (36 C)     Physical Exam Constitutional:      Appearance: Normal appearance.  Cardiovascular:     Rate and Rhythm: Normal rate and regular rhythm.  Pulmonary:     Effort: Pulmonary effort is normal. No respiratory distress.     Breath sounds: Normal breath sounds.  Neurological:     Mental Status: He is alert.     Lab Results:  No results found for this or any previous visit (from the past 24 hour(s)).  BMET No results for input(s): NA, K, CL, CO2, GLUCOSE, BUN, CREATININE, CALCIUM in the last 72 hours. PSA No results found for: PSA No results found for: TESTOSTERONE    Studies/Results: Cystoscopy.     He was prepped with betadine and 2% lidocaine was instilled.     The flexible scope was passed and he was found to have a normal urethra but a membranous stricture that wouldn't admit the scope.  I could see calcifications proximal to the stricture.  The bladder could not be entered.  He was given Cipro 500mg  po.  There were no complications.       Assessment & Plan:  He has a postop membranous stricture with dystrophic calcifications in the prostatic urethra likely secondary to an altered healing process in a radiated prostate.  .  I am going to schedule him for outpatient cystoscopy  With dilation of the stricture and TUR removal of the calcifications.  I have reviewed the risks of bleeding, infection, recurrent strictures, need for CIC, injury to adjacent structures, thrombotic events and anesthetic  complications.    I will also reassess the status of his bladder cancer at cystoscopy.   Meds ordered this encounter  Medications  . ciprofloxacin (CIPRO) tablet 500 mg     Orders Placed This Encounter  Procedures  . Urine Culture    Standing Status:   Future    Standing Expiration Date:   02/02/2020      Return in about 4 days (around 01/09/2020) for Outpatient cystoscopy for the stricture and stones. , Next available.   CC: System, Pcp Not In  Irine Seal 01/05/2020

## 2020-01-07 LAB — URINE CULTURE: Culture: NO GROWTH

## 2020-01-08 ENCOUNTER — Other Ambulatory Visit: Payer: Self-pay

## 2020-01-08 ENCOUNTER — Other Ambulatory Visit (HOSPITAL_COMMUNITY)
Admission: RE | Admit: 2020-01-08 | Discharge: 2020-01-08 | Disposition: A | Discharge status: Home / Self Care | Payer: Medicare Other | Source: Ambulatory Visit | Attending: Urology | Admitting: Urology

## 2020-01-08 DIAGNOSIS — Z01812 Encounter for preprocedural laboratory examination: Secondary | ICD-10-CM | POA: Insufficient documentation

## 2020-01-08 DIAGNOSIS — Z20822 Contact with and (suspected) exposure to covid-19: Secondary | ICD-10-CM | POA: Insufficient documentation

## 2020-01-08 LAB — SARS CORONAVIRUS 2 (TAT 6-24 HRS): SARS Coronavirus 2: NEGATIVE

## 2020-01-11 ENCOUNTER — Other Ambulatory Visit: Payer: Self-pay | Admitting: Urology

## 2020-01-11 ENCOUNTER — Encounter (HOSPITAL_BASED_OUTPATIENT_CLINIC_OR_DEPARTMENT_OTHER): Payer: Self-pay | Admitting: Urology

## 2020-01-11 ENCOUNTER — Ambulatory Visit (HOSPITAL_BASED_OUTPATIENT_CLINIC_OR_DEPARTMENT_OTHER): Payer: Medicare Other | Admitting: Physician Assistant

## 2020-01-11 ENCOUNTER — Encounter (HOSPITAL_BASED_OUTPATIENT_CLINIC_OR_DEPARTMENT_OTHER)
Admission: RE | Disposition: A | Discharge status: Home / Self Care | Payer: Self-pay | Source: Home / Self Care | Attending: Urology

## 2020-01-11 ENCOUNTER — Other Ambulatory Visit: Payer: Self-pay

## 2020-01-11 ENCOUNTER — Ambulatory Visit (HOSPITAL_BASED_OUTPATIENT_CLINIC_OR_DEPARTMENT_OTHER)
Admission: RE | Admit: 2020-01-11 | Discharge: 2020-01-11 | Disposition: A | Discharge status: Home / Self Care | Payer: Medicare Other | Attending: Urology | Admitting: Urology

## 2020-01-11 DIAGNOSIS — N211 Calculus in urethra: Secondary | ICD-10-CM | POA: Diagnosis not present

## 2020-01-11 DIAGNOSIS — C678 Malignant neoplasm of overlapping sites of bladder: Secondary | ICD-10-CM | POA: Insufficient documentation

## 2020-01-11 DIAGNOSIS — Z923 Personal history of irradiation: Secondary | ICD-10-CM | POA: Insufficient documentation

## 2020-01-11 DIAGNOSIS — I10 Essential (primary) hypertension: Secondary | ICD-10-CM | POA: Insufficient documentation

## 2020-01-11 DIAGNOSIS — N210 Calculus in bladder: Secondary | ICD-10-CM | POA: Insufficient documentation

## 2020-01-11 DIAGNOSIS — N138 Other obstructive and reflux uropathy: Secondary | ICD-10-CM | POA: Insufficient documentation

## 2020-01-11 DIAGNOSIS — N401 Enlarged prostate with lower urinary tract symptoms: Secondary | ICD-10-CM | POA: Insufficient documentation

## 2020-01-11 DIAGNOSIS — Z87442 Personal history of urinary calculi: Secondary | ICD-10-CM | POA: Diagnosis not present

## 2020-01-11 DIAGNOSIS — N35913 Unspecified membranous urethral stricture, male: Secondary | ICD-10-CM | POA: Diagnosis present

## 2020-01-11 DIAGNOSIS — Z8546 Personal history of malignant neoplasm of prostate: Secondary | ICD-10-CM | POA: Diagnosis not present

## 2020-01-11 DIAGNOSIS — Z79899 Other long term (current) drug therapy: Secondary | ICD-10-CM | POA: Diagnosis not present

## 2020-01-11 DIAGNOSIS — N390 Urinary tract infection, site not specified: Secondary | ICD-10-CM | POA: Diagnosis not present

## 2020-01-11 HISTORY — DX: Calculus of prostate: N42.0

## 2020-01-11 HISTORY — PX: CYSTOSCOPY WITH URETHRAL DILATATION: SHX5125

## 2020-01-11 HISTORY — DX: Presence of spectacles and contact lenses: Z97.3

## 2020-01-11 HISTORY — DX: Palpitations: R00.2

## 2020-01-11 HISTORY — DX: Personal history of irradiation: Z92.3

## 2020-01-11 HISTORY — DX: Personal history of malignant neoplasm of prostate: Z85.46

## 2020-01-11 HISTORY — PX: TRANSURETHRAL RESECTION OF PROSTATE: SHX73

## 2020-01-11 HISTORY — DX: Unspecified urethral stricture, male, unspecified site: N35.919

## 2020-01-11 LAB — POCT I-STAT, CHEM 8
BUN: 11 mg/dL (ref 8–23)
Calcium, Ion: 1.26 mmol/L (ref 1.15–1.40)
Chloride: 101 mmol/L (ref 98–111)
Creatinine, Ser: 0.6 mg/dL — ABNORMAL LOW (ref 0.61–1.24)
Glucose, Bld: 101 mg/dL — ABNORMAL HIGH (ref 70–99)
HCT: 45 % (ref 39.0–52.0)
Hemoglobin: 15.3 g/dL (ref 13.0–17.0)
Potassium: 4.2 mmol/L (ref 3.5–5.1)
Sodium: 137 mmol/L (ref 135–145)
TCO2: 25 mmol/L (ref 22–32)

## 2020-01-11 SURGERY — CYSTOSCOPY, WITH URETHRAL DILATION
Anesthesia: General | Site: Urethra

## 2020-01-11 MED ORDER — MIDAZOLAM HCL 5 MG/5ML IJ SOLN
INTRAMUSCULAR | Status: DC | PRN
  Administered 2020-01-11: 2 mg via INTRAVENOUS

## 2020-01-11 MED ORDER — PROPOFOL 10 MG/ML IV BOLUS
INTRAVENOUS | Status: DC | PRN
  Administered 2020-01-11: 150 mg via INTRAVENOUS

## 2020-01-11 MED ORDER — SODIUM CHLORIDE 0.9 % IR SOLN
Status: DC | PRN
  Administered 2020-01-11: 6000 mL via INTRAVESICAL

## 2020-01-11 MED ORDER — PHENYLEPHRINE 40 MCG/ML (10ML) SYRINGE FOR IV PUSH (FOR BLOOD PRESSURE SUPPORT)
PREFILLED_SYRINGE | INTRAVENOUS | Status: AC
  Filled 2020-01-11: qty 10

## 2020-01-11 MED ORDER — LACTATED RINGERS IV SOLN
INTRAVENOUS | Status: DC
  Filled 2020-01-11: qty 1000

## 2020-01-11 MED ORDER — SODIUM CHLORIDE 0.9% FLUSH
3.0000 mL | Freq: Two times a day (BID) | INTRAVENOUS | Status: DC
  Filled 2020-01-11: qty 3

## 2020-01-11 MED ORDER — FENTANYL CITRATE (PF) 100 MCG/2ML IJ SOLN
INTRAMUSCULAR | Status: DC | PRN
  Administered 2020-01-11 (×4): 25 ug via INTRAVENOUS

## 2020-01-11 MED ORDER — CIPROFLOXACIN IN D5W 400 MG/200ML IV SOLN
400.0000 mg | INTRAVENOUS | Status: AC
  Administered 2020-01-11: 08:00:00 400 mg via INTRAVENOUS
  Filled 2020-01-11: qty 200

## 2020-01-11 MED ORDER — POTASSIUM CITRATE ER 10 MEQ (1080 MG) PO TBCR: 10.0000 meq | EXTENDED_RELEASE_TABLET | Freq: Three times a day (TID) | ORAL | 2 refills | Status: DC

## 2020-01-11 MED ORDER — ACETAMINOPHEN 10 MG/ML IV SOLN
INTRAVENOUS | Status: AC
  Filled 2020-01-11: qty 100

## 2020-01-11 MED ORDER — FENTANYL CITRATE (PF) 100 MCG/2ML IJ SOLN
25.0000 ug | INTRAMUSCULAR | Status: DC | PRN
  Filled 2020-01-11: qty 1

## 2020-01-11 MED ORDER — IOHEXOL 300 MG/ML  SOLN
INTRAMUSCULAR | Status: DC | PRN
  Administered 2020-01-11: 2 mL

## 2020-01-11 MED ORDER — KETOROLAC TROMETHAMINE 30 MG/ML IJ SOLN
INTRAMUSCULAR | Status: DC | PRN
  Administered 2020-01-11: 30 mg via INTRAVENOUS

## 2020-01-11 MED ORDER — EPHEDRINE 5 MG/ML INJ
INTRAVENOUS | Status: AC
  Filled 2020-01-11: qty 20

## 2020-01-11 MED ORDER — POTASSIUM CITRATE-CITRIC ACID 1100-334 MG/5ML PO SOLN: 10.0000 meq | Freq: Three times a day (TID) | ORAL | 0 refills | Status: DC

## 2020-01-11 MED ORDER — ONDANSETRON HCL 4 MG/2ML IJ SOLN
INTRAMUSCULAR | Status: DC | PRN
  Administered 2020-01-11: 4 mg via INTRAVENOUS

## 2020-01-11 MED ORDER — CIPROFLOXACIN IN D5W 400 MG/200ML IV SOLN
INTRAVENOUS | Status: AC
  Filled 2020-01-11: qty 200

## 2020-01-11 MED ORDER — DEXAMETHASONE SODIUM PHOSPHATE 10 MG/ML IJ SOLN
INTRAMUSCULAR | Status: AC
  Filled 2020-01-11: qty 1

## 2020-01-11 MED ORDER — LIDOCAINE 2% (20 MG/ML) 5 ML SYRINGE
INTRAMUSCULAR | Status: DC | PRN
  Administered 2020-01-11: 100 mg via INTRAVENOUS

## 2020-01-11 MED ORDER — ONDANSETRON HCL 4 MG/2ML IJ SOLN
INTRAMUSCULAR | Status: AC
  Filled 2020-01-11: qty 2

## 2020-01-11 MED ORDER — EPHEDRINE SULFATE-NACL 50-0.9 MG/10ML-% IV SOSY
PREFILLED_SYRINGE | INTRAVENOUS | Status: DC | PRN
  Administered 2020-01-11: 15 mg via INTRAVENOUS
  Administered 2020-01-11: 10 mg via INTRAVENOUS

## 2020-01-11 MED ORDER — PHENYLEPHRINE 40 MCG/ML (10ML) SYRINGE FOR IV PUSH (FOR BLOOD PRESSURE SUPPORT)
PREFILLED_SYRINGE | INTRAVENOUS | Status: DC | PRN
  Administered 2020-01-11: 120 ug via INTRAVENOUS

## 2020-01-11 MED ORDER — DEXAMETHASONE SODIUM PHOSPHATE 4 MG/ML IJ SOLN
INTRAMUSCULAR | Status: DC | PRN
  Administered 2020-01-11: 10 mg via INTRAVENOUS

## 2020-01-11 MED ORDER — FENTANYL CITRATE (PF) 100 MCG/2ML IJ SOLN
INTRAMUSCULAR | Status: AC
  Filled 2020-01-11: qty 2

## 2020-01-11 MED ORDER — MIDAZOLAM HCL 2 MG/2ML IJ SOLN
INTRAMUSCULAR | Status: AC
  Filled 2020-01-11: qty 2

## 2020-01-11 MED ORDER — PROPOFOL 10 MG/ML IV BOLUS
INTRAVENOUS | Status: AC
  Filled 2020-01-11: qty 20

## 2020-01-11 MED ORDER — ACETAMINOPHEN 10 MG/ML IV SOLN
INTRAVENOUS | Status: DC | PRN
  Administered 2020-01-11: 1000 mg via INTRAVENOUS

## 2020-01-11 MED ORDER — ACETAMINOPHEN 500 MG PO TABS
ORAL_TABLET | ORAL | Status: AC
  Filled 2020-01-11: qty 2

## 2020-01-11 MED ORDER — ACETAMINOPHEN 500 MG PO TABS
1000.0000 mg | ORAL_TABLET | Freq: Once | ORAL | Status: DC
  Filled 2020-01-11: qty 2

## 2020-01-11 MED ORDER — KETOROLAC TROMETHAMINE 30 MG/ML IJ SOLN
INTRAMUSCULAR | Status: AC
  Filled 2020-01-11: qty 1

## 2020-01-11 SURGICAL SUPPLY — 40 items
BAG DRAIN URO-CYSTO SKYTR STRL (DRAIN) ×4 IMPLANT
BAG URINE DRAIN 2000ML AR STRL (UROLOGICAL SUPPLIES) IMPLANT
BAG URINE LEG 500ML (DRAIN) IMPLANT
BALLN NEPHROSTOMY (BALLOONS) ×4
BALLOON NEPHROSTOMY (BALLOONS) ×2 IMPLANT
CATH FOLEY 2WAY SLVR  5CC 16FR (CATHETERS)
CATH FOLEY 2WAY SLVR  5CC 20FR (CATHETERS)
CATH FOLEY 2WAY SLVR  5CC 22FR (CATHETERS)
CATH FOLEY 2WAY SLVR 30CC 20FR (CATHETERS) ×4 IMPLANT
CATH FOLEY 2WAY SLVR 5CC 16FR (CATHETERS) IMPLANT
CATH FOLEY 2WAY SLVR 5CC 20FR (CATHETERS) IMPLANT
CATH FOLEY 2WAY SLVR 5CC 22FR (CATHETERS) IMPLANT
CATH HEMA 3WAY 30CC 24FR COUDE (CATHETERS) IMPLANT
CATH HEMA 3WAY 30CC 24FR RND (CATHETERS) IMPLANT
CLOTH BEACON ORANGE TIMEOUT ST (SAFETY) ×6 IMPLANT
ELECT REM PT RETURN 9FT ADLT (ELECTROSURGICAL) ×4
ELECTRODE REM PT RTRN 9FT ADLT (ELECTROSURGICAL) ×2 IMPLANT
EVACUATOR MICROVAS BLADDER (UROLOGICAL SUPPLIES) IMPLANT
GLOVE SURG SS PI 8.0 STRL IVOR (GLOVE) ×4 IMPLANT
GOWN STRL REUS W/ TWL XL LVL3 (GOWN DISPOSABLE) ×2 IMPLANT
GOWN STRL REUS W/TWL XL LVL3 (GOWN DISPOSABLE) ×8 IMPLANT
GUIDEWIRE STR DUAL SENSOR (WIRE) ×2 IMPLANT
HOLDER FOLEY CATH W/STRAP (MISCELLANEOUS) IMPLANT
KIT TURNOVER CYSTO (KITS) ×4 IMPLANT
LOOP CUT BIPOLAR 24F LRG (ELECTROSURGICAL) ×2 IMPLANT
MANIFOLD NEPTUNE II (INSTRUMENTS) ×2 IMPLANT
NDL SAFETY ECLIPSE 18X1.5 (NEEDLE) IMPLANT
NEEDLE HYPO 18GX1.5 SHARP (NEEDLE)
NEEDLE HYPO 22GX1.5 SAFETY (NEEDLE) IMPLANT
NS IRRIG 500ML POUR BTL (IV SOLUTION) ×2 IMPLANT
PACK CYSTO (CUSTOM PROCEDURE TRAY) ×4 IMPLANT
PLUG CATH AND CAP STER (CATHETERS) IMPLANT
SYR 20ML LL LF (SYRINGE) IMPLANT
SYR 30ML LL (SYRINGE) IMPLANT
SYR TOOMEY IRRIG 70ML (MISCELLANEOUS)
SYRINGE TOOMEY IRRIG 70ML (MISCELLANEOUS) IMPLANT
TUBE CONNECTING 12'X1/4 (SUCTIONS) ×1
TUBE CONNECTING 12X1/4 (SUCTIONS) ×1 IMPLANT
TUBING UROLOGY SET (TUBING) ×2 IMPLANT
WATER STERILE IRR 3000ML UROMA (IV SOLUTION) ×6 IMPLANT

## 2020-01-11 NOTE — Anesthesia Preprocedure Evaluation (Addendum)
Anesthesia Evaluation  Patient identified by MRN, date of birth, ID band Patient awake    Reviewed: Allergy & Precautions, NPO status , Patient's Chart, lab work & pertinent test results, reviewed documented beta blocker date and time   Airway Mallampati: III  TM Distance: >3 FB Neck ROM: Full  Mouth opening: Limited Mouth Opening  Dental no notable dental hx. (+) Teeth Intact, Dental Advisory Given   Pulmonary neg pulmonary ROS,    Pulmonary exam normal breath sounds clear to auscultation       Cardiovascular hypertension, Pt. on home beta blockers and Pt. on medications Normal cardiovascular exam+ dysrhythmias + Valvular Problems/Murmurs AI  Rhythm:Regular Rate:Normal  05/2019 TTE - EF 55-60%, concentric LVH. Mod AI, mild MR and TR.   Neuro/Psych negative neurological ROS  negative psych ROS   GI/Hepatic Neg liver ROS, GERD  ,  Endo/Other  negative endocrine ROS  Renal/GU negative Renal ROS   H/o prostate CA negative genitourinary   Musculoskeletal negative musculoskeletal ROS (+)   Abdominal   Peds  Hematology negative hematology ROS (+)   Anesthesia Other Findings   Reproductive/Obstetrics                           Anesthesia Physical Anesthesia Plan  ASA: III  Anesthesia Plan: General   Post-op Pain Management:    Induction: Intravenous  PONV Risk Score and Plan: Ondansetron, Dexamethasone and Midazolam  Airway Management Planned: LMA  Additional Equipment:   Intra-op Plan:   Post-operative Plan: Extubation in OR  Informed Consent: I have reviewed the patients History and Physical, chart, labs and discussed the procedure including the risks, benefits and alternatives for the proposed anesthesia with the patient or authorized representative who has indicated his/her understanding and acceptance.     Dental advisory given  Plan Discussed with: CRNA  Anesthesia Plan  Comments:         Anesthesia Quick Evaluation

## 2020-01-11 NOTE — Anesthesia Postprocedure Evaluation (Signed)
Anesthesia Post Note  Patient: Jack Mcdonald  Procedure(s) Performed: CYSTOSCOPY WITH URETHRAL DILATATION AND LITHOLAPAXY (N/A Urethra) TRANSURETHRAL RESECTION REMOVAL OF STONES (N/A Bladder)     Patient location during evaluation: PACU Anesthesia Type: General Level of consciousness: awake and alert Pain management: pain level controlled Vital Signs Assessment: post-procedure vital signs reviewed and stable Respiratory status: spontaneous breathing, nonlabored ventilation, respiratory function stable and patient connected to nasal cannula oxygen Cardiovascular status: blood pressure returned to baseline and stable Postop Assessment: no apparent nausea or vomiting Anesthetic complications: no    Last Vitals:  Vitals:   01/11/20 0930 01/11/20 0945  BP: (!) 149/89 127/76  Pulse: 74 71  Resp: 16 16  Temp:    SpO2: 99% 99%    Last Pain:  Vitals:   01/11/20 0945  TempSrc:   PainSc: 0-No pain                 Brando Taves L Jhoana Upham

## 2020-01-11 NOTE — Discharge Instructions (Addendum)
CYSTOSCOPY HOME CARE INSTRUCTIONS  Activity: Rest for the remainder of the day.  Do not drive or operate equipment today.  You may resume normal activities in one to two days as instructed by your physician.   Meals: Drink plenty of liquids and eat light foods such as gelatin or soup this evening.  You may return to a normal meal plan tomorrow.  Return to Work: You may return to work in one to two days or as instructed by your physician.  Special Instructions / Symptoms: Call your physician if any of these symptoms occur:   -persistent or heavy bleeding  -bleeding which continues after first few urination  -large blood clots that are difficult to pass  -urine stream diminishes or stops completely  -fever equal to or higher than 101 degrees Farenheit.  -cloudy urine with a strong, foul odor  -severe pain  Females should always wipe from front to back after elimination.  You may feel some burning pain when you urinate.  This should disappear with time.  Applying moist heat to the lower abdomen or a hot tub bath may help relieve the pain. \  You had extensive calcium deposition secondary to an abnormal healing process that is not unusual with prior radiation.   I am going to send a prescription for potassium citrate to take 3 x's daily to try to help prevent the stone formation.   Please continue to drink plenty of water.  100oz daily would be a reasonable goal.  I will order blood work to check your potassium for about 2 weeks from now.  I will defer further treatment for the bladder cancer until we are sure you are healing well.   I didn't see any recurrent tumors on cystoscopy today.    Patient Signature:  ________________________________________________________  Nurse's Signature:  ________________________________________________________  Potassium Citrate Extended-Release Tablets What is this medicine? POTASSIUM CITRATE (poe TASS i um SI treyt) is a potassium salt. It helps to  make the urine more alkaline or less acidic. This medicine is used to prevent kidney stones. This medicine may be used for other purposes; ask your health care provider or pharmacist if you have questions. COMMON BRAND NAME(S): Urocit-K What should I tell my health care provider before I take this medicine? They need to know if you have any of these conditions:  dehydration  diabetes  heart damage, failure  kidney disease  stomach ulcers or other problems  swallowing problems  urinary tract infection  an unusual or allergic reaction to potassium citrate, other medicines, foods, dyes, or preservatives  pregnant or trying to get pregnant  breast-feeding How should I use this medicine? Take this medicine with a full glass of water. Follow the directions on the prescription label. Do not chew, crush or suck on the tablets. Take this medicine in an upright or sitting position. Drink a sip of water before taking the medicine to help you swallow it. Take this medicine with a meal or a snack. Take your doses at regular intervals. Do not take your medicine more often than directed. Talk to your pediatrician regarding the use of this medicine in children. Special care may be needed. Overdosage: If you think you have taken too much of this medicine contact a poison control center or emergency room at once. NOTE: This medicine is only for you. Do not share this medicine with others. What if I miss a dose? If you miss a dose, take it as soon as you can. If  it is almost time for your next dose, take only that dose. Do not take double or extra doses. What may interact with this medicine? Do not take this medicine with any of the following medications:  ammonium chloride  antacids  eplerenone  histamine blockers for cold or allergy  medicines for bladder spasm like oxybutynin and tolterodine  medicines for movement abnormalities or Parkinson's disease  potassium  supplements  potassium-sparing diuretics  sodium polystyrene sulfonate  some medicines for the stomach like chlordiazepoxide and dicyclomine This medicine may also interact with the following medications:  amphetamine, dextroamphetamine, or similar drugs  aspirin and aspirin-like drugs  digoxin  lithium  methenamine  NSAIDs, medicines for pain and inflammation, like ibuprofen or naproxen  quinidine  quinolone antibiotics  some medicines for high blood pressure, heart problems, kidney protection This list may not describe all possible interactions. Give your health care provider a list of all the medicines, herbs, non-prescription drugs, or dietary supplements you use. Also tell them if you smoke, drink alcohol, or use illegal drugs. Some items may interact with your medicine. What should I watch for while using this medicine? Visit your doctor or health care professional for regular check ups. Tell your doctor if you have trouble swallowing this medicine, or if it seems to stick in your throat. You will need to have important blood work done while on this medicine. You may need to be on a special diet while taking this medicine. Ask your doctor. Also, ask how many glasses of fluid you need to drink a day. You must not get dehydrated. You may see the shell of extended-release tablet in the stool. This is normal. The medicine from the tablet has been released. What side effects may I notice from receiving this medicine? Side effects that you should report to your doctor or health care professional as soon as possible:  allergic reactions like skin rash, itching or hives, swelling of the face, lips, or tongue  bloody, black, tarry stools  confused, dizzy, lightheaded, faint  irregular heartbeat, chest pain  numbness or tingling in hands or feet  pain on swallowing  stomach pain  unusual bleeding or bruising  unusually weak or tired Side effects that usually do not  require medical attention (report to your doctor or health care professional if they continue or are bothersome):  diarrhea  loose stools  nausea, vomiting  stomach upset This list may not describe all possible side effects. Call your doctor for medical advice about side effects. You may report side effects to FDA at 1-800-FDA-1088. Where should I keep my medicine? Keep out of the reach of children. Store at room temperature between 15 and 30 degrees C (59 and 86 degrees F). Keep container closed tightly. Throw away any unused medicine after the expiration date. NOTE: This sheet is a summary. It may not cover all possible information. If you have questions about this medicine, talk to your doctor, pharmacist, or health care provider.  2020 Elsevier/Gold Standard (2008-06-19 13:18:50)   NO ADVIL, ALEVE, MOTRIN, IBUPROFEN UNTIL 3 PM   Post Anesthesia Home Care Instructions  Activity: Get plenty of rest for the remainder of the day. A responsible adult should stay with you for 24 hours following the procedure.  For the next 24 hours, DO NOT: -Drive a car -Paediatric nurse -Drink alcoholic beverages -Take any medication unless instructed by your physician -Make any legal decisions or sign important papers.  Meals: Start with liquid foods such as gelatin or  soup. Progress to regular foods as tolerated. Avoid greasy, spicy, heavy foods. If nausea and/or vomiting occur, drink only clear liquids until the nausea and/or vomiting subsides. Call your physician if vomiting continues.  Special Instructions/Symptoms: Your throat may feel dry or sore from the anesthesia or the breathing tube placed in your throat during surgery. If this causes discomfort, gargle with warm salt water. The discomfort should disappear within 24 hours.  If you had a scopolamine patch placed behind your ear for the management of post- operative nausea and/or vomiting:  1. The medication in the patch is effective  for 72 hours, after which it should be removed.  Wrap patch in a tissue and discard in the trash. Wash hands thoroughly with soap and water. 2. You may remove the patch earlier than 72 hours if you experience unpleasant side effects which may include dry mouth, dizziness or visual disturbances. 3. Avoid touching the patch. Wash your hands with soap and water after contact with the patch.

## 2020-01-11 NOTE — Transfer of Care (Signed)
Immediate Anesthesia Transfer of Care Note  Patient: Jack Mcdonald  Procedure(s) Performed: Procedure(s) (LRB): CYSTOSCOPY WITH URETHRAL DILATATION AND LITHOLAPAXY (N/A) TRANSURETHRAL RESECTION REMOVAL OF STONES (N/A)  Patient Location: PACU  Anesthesia Type: General  Level of Consciousness: awake, sedated, patient cooperative and responds to stimulation  Airway & Oxygen Therapy: Patient Spontanous Breathing and Patient connected to [place] 02 and soft FM   Post-op Assessment: Report given to PACU RN, Post -op Vital signs reviewed and stable and Patient moving all extremities  Post vital signs: Reviewed and stable  Complications: No apparent anesthesia complications

## 2020-01-11 NOTE — Anesthesia Procedure Notes (Signed)
Procedure Name: LMA Insertion Date/Time: 01/11/2020 8:32 AM Performed by: Suan Halter, CRNA Pre-anesthesia Checklist: Patient identified, Emergency Drugs available, Suction available and Patient being monitored Patient Re-evaluated:Patient Re-evaluated prior to induction Oxygen Delivery Method: Circle system utilized Preoxygenation: Pre-oxygenation with 100% oxygen Induction Type: IV induction Ventilation: Mask ventilation without difficulty LMA: LMA inserted LMA Size: 4.0 Number of attempts: 1 Airway Equipment and Method: Bite block Placement Confirmation: positive ETCO2 Tube secured with: Tape Dental Injury: Teeth and Oropharynx as per pre-operative assessment

## 2020-01-11 NOTE — Op Note (Signed)
Procedure: 1.  Cystoscopy with dilation of membranous urethral stricture. 2.  Cystolitholapaxy greater than 2.5 cm stone burden.  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  Postop diagnosis: Same with additional bladder stones.  Surgeon: Dr. Irine Seal.  Anesthesia: General.  Specimen: Stone fragments.  Drain: None.  EBL: None.  Complications: None.  Indications: The patient is a 64 year old male with a history of prostate cancer previously treated with radiation therapy and more recently transurethral resection of bladder tumors from the trigone and prostatic urethra.  He has had persistent irritative voiding symptoms since the procedure and persistent pyuria with negative cultures.  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  He returns today for definitive therapy.  Procedure: He was taken the operating room where he was given Cipro.  A general anesthetic was induced.  He was placed in lithotomy position and fitted with PAS hose.  His perineum and genitalia were prepped with Betadine solution he was draped in usual sterile fashion.  Cystoscopy was performed using a 23 [place] scope and 30 degree lens.  Examination revealed a normal urethra to the proximal bulb where he was noted to have a moderately severe membranous urethral stricture that was not passable by the scope.  A sensor guidewire was passed into the bladder under fluoroscopic guidance and with the wire in place I was able to negotiate the scope into the bladder.  He was noted to have significant dystrophic changes within the prostatic urethra with multiple calculi extending back to the trigone and the remainder of the resection bed which was circumferential at the bladder neck.  There was one 1.5 cm stone with multiple remaining stones for a total stone diameter of greater than 2.5 cm.  The cystoscope was  removed leaving the wire across the stricture.  A 15 cm x 24 French high-pressure balloon was passed over the wire across the stricture and inflated to 20 atm of pressure.  The balloon was then removed and the cystoscope was reinserted alongside the wire.  The stricture was noted to be appropriately disrupted and further inspection of the bladder demonstrated mild trabeculation but no other mucosal lesions other than the resection bed in the trigone and no recurrent tumors.  Ureteral orifices were close to the resection bed but intact and patent.  The 13 French continuous-flow resectoscope sheath was then passed with the aid of a visual obturator and a larger stone because it was rather flat and profile was actually crushed with the tip of the scope and evacuated from the bladder.  The scope was then fitted with the [facility] handle with a bipolar loop and the 30 degree lens.  Saline was used as irrigant.  The remaining stones and dystrophic fibrinous material was scraped out of the prostatic urethra into the bladder and evacuated.  The the the dystrophic calcifications and fibrinous material was then scraped off the bladder neck circumferentially in the resection bed in the trigone.  Minor bleeding was cauterized but extensive cautery was avoided as I did not want to restart the healing process.  All of the debris was then evacuated from the bladder.  Final inspection demonstrated no active bleeding no residual calcium deposits and excellent debulking of the fibrinous material.  It was not felt that placement of a Foley catheter was indicated.  He was taken down from lithotomy position, his anesthetic was reversed and he was moved recovery in stable condition.  There were  no complications.

## 2020-01-11 NOTE — Interval H&P Note (Signed)
History and Physical Interval Note:  He has no new complaints or questions.   01/11/2020 7:22 AM  Jack Mcdonald  has presented today for surgery, with the diagnosis of [place].  The various methods of treatment have been discussed with the patient and family. After consideration of risks, benefits and other options for treatment, the patient has consented to  Procedure(s): CYSTOSCOPY WITH URETHRAL DILATATION (N/A) TRANSURETHRAL RESECTION REMOVAL OF STONES (N/A) as a surgical intervention.  The patient's history has been reviewed, patient examined, no change in status, stable for surgery.  I have reviewed the patient's chart and labs.  Questions were answered to the patient's satisfaction.     Irine Seal

## 2020-01-12 ENCOUNTER — Ambulatory Visit: Payer: PRIVATE HEALTH INSURANCE

## 2020-01-12 ENCOUNTER — Telehealth: Payer: Self-pay

## 2020-01-12 NOTE — Telephone Encounter (Signed)
-----   Message from Dorisann Frames, RN sent at 01/11/2020 12:44 PM EST ----- Regarding: FW: Lab and appt reminder mailed. Call pt to notify of meds and labs ordered ----- Message ----- From: Irine Seal, MD Sent: 01/11/2020  12:27 PM EST To: Dorisann Frames, RN Subject: RE:                                            Yes ----- Message ----- From: Dorisann Frames, RN Sent: 01/11/2020  12:25 PM EST To: Irine Seal, MD  He has appt on 2/26 at 1pm.. Is that okay? ----- Message ----- From: Irine Seal, MD Sent: 01/11/2020   9:24 AM EST To: Dorisann Frames, RN  I don't see that he has a post op f/u with [place] yet.   I am going to put him on a potassium citrate soln and have ordered a BMP for 2 weeks from now.  Just wanted to give you an FYI.

## 2020-01-12 NOTE — Telephone Encounter (Signed)
Pt notified of labs and appt mailed. Pt is going to today to pick up rx

## 2020-01-19 ENCOUNTER — Ambulatory Visit: Payer: PRIVATE HEALTH INSURANCE

## 2020-01-23 ENCOUNTER — Other Ambulatory Visit: Payer: Self-pay | Admitting: Urology

## 2020-01-26 ENCOUNTER — Other Ambulatory Visit: Payer: Self-pay

## 2020-01-26 ENCOUNTER — Ambulatory Visit (INDEPENDENT_AMBULATORY_CARE_PROVIDER_SITE_OTHER): Payer: Medicare Other | Admitting: Urology

## 2020-01-26 VITALS — BP 126/68 | HR 73 | Temp 96.8°F | Ht 70.0 in | Wt 195.0 lb

## 2020-01-26 DIAGNOSIS — N210 Calculus in bladder: Secondary | ICD-10-CM

## 2020-01-26 DIAGNOSIS — N99112 Postprocedural membranous urethral stricture: Secondary | ICD-10-CM | POA: Diagnosis not present

## 2020-01-26 DIAGNOSIS — Z8551 Personal history of malignant neoplasm of bladder: Secondary | ICD-10-CM | POA: Diagnosis not present

## 2020-01-26 LAB — POCT URINALYSIS DIPSTICK
Bilirubin, UA: NEGATIVE
Glucose, UA: NEGATIVE
Ketones, UA: NEGATIVE
Nitrite, UA: NEGATIVE
Protein, UA: POSITIVE — AB
Spec Grav, UA: <=1.005 — AB (ref 1.010–1.025)
Urobilinogen, UA: 0.2 E.U./dL
pH, UA: 6 (ref 5.0–8.0)

## 2020-01-26 NOTE — Progress Notes (Signed)
Subjective:  1. Bladder stones   2. History of carcinoma of bladder   3. Postprocedural membranous urethral stricture, male     Jack Mcdonald returns today in f/u from his recent cystoscopy with removal of dystrophic calcifications and dilation of a membranous stricture.  He has a good stream.  He has reduced urgency but he does have frequency with 100oz water intake daily.   He has no hematuria.   He was to have BCG but had not been able to get that started.  I didn't see any recurrent tumors at cystoscopy.   . IPSS    Row Name 01/26/20 1300         International Prostate Symptom Score   How often have you had the sensation of not emptying your bladder?  Less than 1 in 5     How often have you had to urinate less than every two hours?  Almost always     How often have you found you stopped and started again several times when you urinated?  Less than half the time     How often have you found it difficult to postpone urination?  About half the time     How often have you had a weak urinary stream?  Less than 1 in 5 times     How often have you had to strain to start urination?  Not at All     How many times did you typically get up at night to urinate?  3 Times     Total IPSS Score  15       Quality of Life due to urinary symptoms   If you were to spend the rest of your life with your urinary condition just the way it is now how would you feel about that?  Mixed         ROS:  ROS:  A complete review of systems was performed.  All systems are negative except for pertinent findings as noted.   Review of Systems  Genitourinary: Positive for frequency.  All other systems reviewed and are negative.   No Known Allergies  Outpatient Encounter Medications as of 01/26/2020  Medication Sig  . citric acid-potassium citrate (POLYCITRA) 1100-334 MG/5ML solution Take 5 mLs (10 mEq total) by mouth 3 (three) times daily.  . hydrochlorothiazide (MICROZIDE) 12.5 MG capsule Take 12.5 mg by mouth  daily.   . metoprolol tartrate (LOPRESSOR) 25 MG tablet Take 12.5 mg by mouth 2 (two) times daily.   . tamsulosin (FLOMAX) 0.4 MG CAPS capsule Take 1 capsule (0.4 mg total) by mouth daily. (Patient taking differently: Take 0.4 mg by mouth at bedtime. )  . telmisartan (MICARDIS) 40 MG tablet Take 40 mg by mouth daily.    Facility-Administered Encounter Medications as of 01/26/2020  Medication  . gemcitabine (GEMZAR) chemo syringe for bladder instillation 2,000 mg    Past Medical History:  Diagnosis Date  . BPH with urinary obstruction    urologist--- dr Jeffie Pollock  . Dysphagia    01-05-2020  per pt states chews well and small bites  . GERD (gastroesophageal reflux disease)   . History of external beam radiation therapy    prostate cancer--- completed 2014  . History of kidney stones   . History of prostate cancer urologist--- dr Jeffie Pollock   dx 02-21-2013   Stage T2a, Gleason 3+4;   completed radiation  . Hypertension    followed by cardiology-- Dr Carrolyn Leigh ([place])   per cardiololgy  note pt had ETT 03/ 2018 negative for ishcemia  . Moderate aortic regurgitation    per 06-24-19 echo dr Lennette Bihari [facility]  (scanned in media in epic)  . Nocturia   . Palpitations    followed by cardiologist--- dr Raliegh Ip. Gibson Ramp;  per dr Gibson Ramp note , holter monior 01/ 2018 infrequent supraventricular and ventricular ectopic activity  . Prostate calculus   . Prostate cancer ([place]) 01/04/13   Adenocarcinoma,gleason=3+4=7,& 3+3=6,PSA=3.94,Volume=63cc  . Urethral stricture   . Wears glasses     Past Surgical History:  Procedure Laterality Date  . COLONOSCOPY  age 29   few polyps  . CYSTOSCOPY WITH URETHRAL DILATATION N/A 01/11/2020   Procedure: CYSTOSCOPY WITH URETHRAL DILATATION AND LITHOLAPAXY;  Surgeon: Jack Seal, MD;  Location: [facility];  Service: Urology;  Laterality: N/A;  . PROSTATE BIOPSY  01/04/13   Adenocarncinoma  . TRANSURETHRAL  RESECTION OF BLADDER TUMOR WITH MITOMYCIN-C Bilateral 11/09/2019   Procedure: TRANSURETHRAL RESECTION OF BLADDER TUMOR;  Surgeon: Jack Seal, MD;  Location: [facility];  Service: Urology;  Laterality: Bilateral;  . TRANSURETHRAL RESECTION OF PROSTATE N/A 01/11/2020   Procedure: TRANSURETHRAL RESECTION REMOVAL OF STONES;  Surgeon: Jack Seal, MD;  Location: [facility];  Service: Urology;  Laterality: N/A;    Social History   Socioeconomic History  . Marital status: Married    Spouse name: Not on file  . Number of children: 1  . Years of education: Not on file  . Highest education level: Not on file  Occupational History  . Occupation: Painter  Tobacco Use  . Smoking status: Never Smoker  . Smokeless tobacco: Never Used  Substance and Sexual Activity  . Alcohol use: Yes    Comment: 2 per day mixed drinks  . Drug use: No  . Sexual activity: Yes  Other Topics Concern  . Not on file  Social History Narrative  . Not on file   Social Determinants of Health   Financial Resource Strain:   . Difficulty of Paying Living Expenses: Not on file  Food Insecurity:   . Worried About Charity fundraiser in the Last Year: Not on file  . Ran Out of Food in the Last Year: Not on file  Transportation Needs:   . Lack of Transportation (Medical): Not on file  . Lack of Transportation (Non-Medical): Not on file  Physical Activity:   . Days of Exercise per Week: Not on file  . Minutes of Exercise per Session: Not on file  Stress:   . Feeling of Stress : Not on file  Social Connections:   . Frequency of Communication with Friends and Family: Not on file  . Frequency of Social Gatherings with Friends and Family: Not on file  . Attends Religious Services: Not on file  . Active Member of Clubs or Organizations: Not on file  . Attends Archivist Meetings: Not on file  . Marital Status: Not on file  Intimate Partner Violence:   . Fear of Current or  Ex-Partner: Not on file  . Emotionally Abused: Not on file  . Physically Abused: Not on file  . Sexually Abused: Not on file    Family History  Problem Relation Age of Onset  . Colon cancer Neg Hx   . Colon polyps Neg Hx   . Rectal cancer Neg Hx   . Stomach cancer Neg Hx        Objective: Vitals:  01/26/20 1307  BP: 126/68  Pulse: 73  Temp: (!) 96.8 F (36 C)     Physical Exam  Lab Results:  Results for orders placed or performed in visit on 01/26/20 (from the past 24 hour(s))  POCT urinalysis dipstick     Status: Abnormal   Collection Time: 01/26/20  1:12 PM  Result Value Ref Range   Color, UA yellow    Clarity, UA     Glucose, UA Negative Negative   Bilirubin, UA neg    Ketones, UA neg    Spec Grav, UA <=1.005 (A) 1.010 - 1.025   Blood, UA moderate non hemol    pH, UA 6.0 5.0 - 8.0   Protein, UA Positive (A) Negative   Urobilinogen, UA 0.2 0.2 or 1.0 E.U./dL   Nitrite, UA neg    Leukocytes, UA Small (1+) (A) Negative   Appearance clear    Odor      BMET No results for input(s): NA, K, CL, CO2, GLUCOSE, BUN, CREATININE, CALCIUM in the last 72 hours. PSA No results found for: PSA No results found for: TESTOSTERONE  Path report is pending for the stone analysis.   Studies/Results: No results found.    Assessment & Plan: Membranous stricture.  He is voiding with a good stream s/p dilation.    Bladder and prostatic urethral dystrophic calcification.   He will continue the high fluid intake and polycitra.  History of bladder cancer.   He will begin BCG in about 3 weeks.  He has a second Covid vaccine scheduled on 3/16 so we will start after that.   No orders of the defined types were placed in this encounter.    Orders Placed This Encounter  Procedures  . POCT urinalysis dipstick      Return in about 3 weeks (around 02/16/2020) for To initiate BCG induction. .   CC: Macon Large, MD      Jack Mcdonald 01/26/2020

## 2020-02-06 ENCOUNTER — Telehealth: Payer: Self-pay | Admitting: Urology

## 2020-02-06 NOTE — Telephone Encounter (Signed)
Pt. called and made aware to continue citric acid in liquid form instead of tablet and that rx was sent in.

## 2020-02-06 NOTE — Telephone Encounter (Signed)
Patient called and asked if he is to continue the citric acid he needs a refill sent in.

## 2020-02-07 ENCOUNTER — Other Ambulatory Visit: Payer: Self-pay

## 2020-02-07 DIAGNOSIS — N210 Calculus in bladder: Secondary | ICD-10-CM

## 2020-02-07 MED ORDER — POTASSIUM CITRATE-CITRIC ACID 1100-334 MG/5ML PO SOLN: 10.0000 meq | Freq: Three times a day (TID) | ORAL | 1 refills | Status: DC

## 2020-02-14 ENCOUNTER — Ambulatory Visit: Payer: PRIVATE HEALTH INSURANCE

## 2020-02-16 ENCOUNTER — Ambulatory Visit: Payer: PRIVATE HEALTH INSURANCE

## 2020-02-20 ENCOUNTER — Other Ambulatory Visit: Payer: Self-pay

## 2020-02-20 ENCOUNTER — Ambulatory Visit (INDEPENDENT_AMBULATORY_CARE_PROVIDER_SITE_OTHER): Payer: Medicare Other

## 2020-02-20 VITALS — Temp 98.4°F

## 2020-02-20 DIAGNOSIS — N210 Calculus in bladder: Secondary | ICD-10-CM

## 2020-02-20 DIAGNOSIS — Z8551 Personal history of malignant neoplasm of bladder: Secondary | ICD-10-CM

## 2020-02-20 LAB — POCT URINALYSIS DIPSTICK
Bilirubin, UA: NEGATIVE
Glucose, UA: NEGATIVE
Ketones, UA: NEGATIVE
Nitrite, UA: NEGATIVE
Protein, UA: POSITIVE — AB
Spec Grav, UA: <=1.005 — AB (ref 1.010–1.025)
Urobilinogen, UA: 0.2 E.U./dL
pH, UA: 8.5 — AB (ref 5.0–8.0)

## 2020-02-20 MED ORDER — BCG LIVE 50 MG IS SUSR
3.2400 mL | Freq: Once | INTRAVESICAL | Status: AC
  Administered 2020-02-20: 81 mg via INTRAVESICAL

## 2020-02-20 MED ORDER — CIPROFLOXACIN HCL 500 MG PO TABS: 500.0000 mg | ORAL_TABLET | Freq: Once | ORAL | Status: DC

## 2020-02-20 NOTE — Progress Notes (Signed)
.  BCG Bladder Instillation  BCG # 1 of 6  Due to Bladder Cancer patient is present today for a BCG treatment. Patient was cleaned and prepped in a sterile fashion with betadine. A 14 FR catheter was inserted, urine return was noted 50 ml, urine was yellow in color.  52ml of reconstituted BCG was instilled into the bladder. The catheter was then removed. Patient tolerated well, no complications were noted  Preformed by: H. Laikynn Pollio LPN  Follow up/ Additional notes: 1 week bcg 2 of 6

## 2020-02-27 ENCOUNTER — Ambulatory Visit: Payer: Medicare Other

## 2020-02-28 ENCOUNTER — Ambulatory Visit (INDEPENDENT_AMBULATORY_CARE_PROVIDER_SITE_OTHER): Payer: Medicare Other

## 2020-02-28 ENCOUNTER — Other Ambulatory Visit: Payer: Self-pay

## 2020-02-28 DIAGNOSIS — C678 Malignant neoplasm of overlapping sites of bladder: Secondary | ICD-10-CM | POA: Diagnosis not present

## 2020-02-28 LAB — POCT URINALYSIS DIPSTICK
Bilirubin, UA: NEGATIVE
Glucose, UA: NEGATIVE
Ketones, UA: NEGATIVE
Nitrite, UA: NEGATIVE
Protein, UA: POSITIVE — AB
Spec Grav, UA: 1.015 (ref 1.010–1.025)
Urobilinogen, UA: 0.2 E.U./dL
pH, UA: 8 (ref 5.0–8.0)

## 2020-02-28 MED ORDER — BCG LIVE 50 MG IS SUSR
3.2400 mL | Freq: Once | INTRAVESICAL | Status: AC
  Administered 2020-02-28: 81 mg via INTRAVESICAL

## 2020-02-28 NOTE — Progress Notes (Signed)
BCG Bladder Instillation  BCG # 2  Due to Bladder Cancer patient is present today for a BCG treatment. Patient was cleaned and prepped in a sterile fashion with betadine. A 14FR catheter was inserted, urine return was noted 23ml, urine was yellow in color.  91ml of reconstituted BCG was instilled into the bladder. The catheter was then removed. Patient tolerated well, no complications were noted  Preformed by: Gibson Ramp RN  Follow up/ Additional notes: keep schedule appt

## 2020-03-06 ENCOUNTER — Ambulatory Visit: Payer: Medicare Other

## 2020-03-06 ENCOUNTER — Other Ambulatory Visit: Payer: Self-pay

## 2020-03-06 ENCOUNTER — Ambulatory Visit (INDEPENDENT_AMBULATORY_CARE_PROVIDER_SITE_OTHER): Payer: Medicare Other

## 2020-03-06 VITALS — Temp 97.2°F

## 2020-03-06 DIAGNOSIS — C678 Malignant neoplasm of overlapping sites of bladder: Secondary | ICD-10-CM

## 2020-03-06 LAB — POCT URINALYSIS DIPSTICK
Bilirubin, UA: NEGATIVE
Glucose, UA: NEGATIVE
Ketones, UA: NEGATIVE
Nitrite, UA: NEGATIVE
Protein, UA: NEGATIVE
Spec Grav, UA: 1.01 (ref 1.010–1.025)
Urobilinogen, UA: 0.2 E.U./dL
pH, UA: 8.5 — AB (ref 5.0–8.0)

## 2020-03-06 MED ORDER — BCG LIVE 50 MG IS SUSR
3.2400 mL | Freq: Once | INTRAVESICAL | Status: AC
  Administered 2020-03-06: 81 mg via INTRAVESICAL

## 2020-03-06 NOTE — Progress Notes (Signed)
BCG Bladder Instillation  BCG # 3 of 6  Due to Bladder Cancer patient is present today for a BCG treatment. Patient was cleaned and prepped in a sterile fashion with betadine. A 14FR catheter was inserted, urine return was noted 70ml, urine was yellow in color.  24ml of reconstituted BCG was instilled into the bladder. The catheter was then removed. Patient tolerated well, no complications were noted  Preformed by: H. Malea Swilling LPN  Follow up/ Additional notes: 1 week

## 2020-03-13 ENCOUNTER — Ambulatory Visit (INDEPENDENT_AMBULATORY_CARE_PROVIDER_SITE_OTHER): Payer: Medicare Other

## 2020-03-13 ENCOUNTER — Other Ambulatory Visit: Payer: Self-pay

## 2020-03-13 DIAGNOSIS — C678 Malignant neoplasm of overlapping sites of bladder: Secondary | ICD-10-CM | POA: Diagnosis not present

## 2020-03-13 LAB — POCT URINALYSIS DIPSTICK
Bilirubin, UA: NEGATIVE
Glucose, UA: NEGATIVE
Ketones, UA: NEGATIVE
Nitrite, UA: NEGATIVE
Protein, UA: POSITIVE — AB
Spec Grav, UA: <=1.005 — AB (ref 1.010–1.025)
Urobilinogen, UA: 0.2 E.U./dL
pH, UA: 8.5 — AB (ref 5.0–8.0)

## 2020-03-13 MED ORDER — BCG LIVE 50 MG IS SUSR
3.2400 mL | Freq: Once | INTRAVESICAL | Status: AC
  Administered 2020-03-13: 09:00:00 81 mg via INTRAVESICAL

## 2020-03-13 NOTE — Progress Notes (Signed)
BCG Bladder Instillation  BCG # 4 of 6  Due to Bladder Cancer patient is present today for a BCG treatment. Patient was cleaned and prepped in a sterile fashion with betadine. A 14FR catheter was inserted, urine return was noted 85ml, urine was yellow in color.  56ml of reconstituted BCG was instilled into the bladder. The catheter was then removed. Patient tolerated well, no complications were noted  Preformed by: Gibson Ramp, RN  Follow up/ Additional notes: nurse visit one week

## 2020-03-15 ENCOUNTER — Telehealth: Payer: Self-pay

## 2020-03-15 ENCOUNTER — Other Ambulatory Visit: Payer: PRIVATE HEALTH INSURANCE | Admitting: Urology

## 2020-03-15 NOTE — Telephone Encounter (Signed)
Left message to continue medication

## 2020-03-15 NOTE — Telephone Encounter (Signed)
-----   Message from Banks, LPN sent at [id number]  1:28 PM EDT -----  ----- Message ----- From: Irine Seal, MD Sent: 03/13/2020   9:29 AM EDT To: Iris Pert, LPN  It's potassium citrate and I think it would be best for him to stay on it until we do his next cystoscopy at least. ----- Message ----- From: Iris Pert, LPN Sent: [id number]   9:10 AM EDT To: Irine Seal, MD  Does pt.need to continue citric acid for his bladder stone? Pt. is on his last bottle and doesn't know if this will be a continuation.

## 2020-03-20 ENCOUNTER — Ambulatory Visit (INDEPENDENT_AMBULATORY_CARE_PROVIDER_SITE_OTHER): Payer: Medicare Other

## 2020-03-20 DIAGNOSIS — C678 Malignant neoplasm of overlapping sites of bladder: Secondary | ICD-10-CM | POA: Diagnosis not present

## 2020-03-20 LAB — POCT URINALYSIS DIPSTICK
Bilirubin, UA: NEGATIVE
Glucose, UA: NEGATIVE
Ketones, UA: NEGATIVE
Nitrite, UA: NEGATIVE
Protein, UA: POSITIVE — AB
Spec Grav, UA: 1.01 (ref 1.010–1.025)
Urobilinogen, UA: 0.2 E.U./dL
pH, UA: 7.5 (ref 5.0–8.0)

## 2020-03-20 MED ORDER — BCG LIVE 50 MG IS SUSR
3.2400 mL | Freq: Once | INTRAVESICAL | Status: AC
  Administered 2020-03-20: 81 mg via INTRAVESICAL

## 2020-03-20 NOTE — Patient Instructions (Signed)

## 2020-03-20 NOTE — Progress Notes (Signed)
BCG Bladder Instillation  BCG # 5  Due to Bladder Cancer patient is present today for a BCG treatment. Patient was cleaned and prepped in a sterile fashion with betadine. A 14FR catheter was inserted, urine return was noted 74ml, urine was yellow in color.  11ml of reconstituted BCG was instilled into the bladder. The catheter was then removed. Patient tolerated well, no complications were noted  Preformed by: Gibson Ramp, RN  Follow up/ Additional notes: keep scheduled [place]

## 2020-03-27 ENCOUNTER — Ambulatory Visit (INDEPENDENT_AMBULATORY_CARE_PROVIDER_SITE_OTHER): Payer: Medicare Other

## 2020-03-27 ENCOUNTER — Other Ambulatory Visit: Payer: Self-pay

## 2020-03-27 DIAGNOSIS — C678 Malignant neoplasm of overlapping sites of bladder: Secondary | ICD-10-CM

## 2020-03-27 LAB — POCT URINALYSIS DIPSTICK
Bilirubin, UA: NEGATIVE
Glucose, UA: NEGATIVE
Ketones, UA: NEGATIVE
Nitrite, UA: NEGATIVE
Protein, UA: POSITIVE — AB
Spec Grav, UA: 1.01 (ref 1.010–1.025)
Urobilinogen, UA: 0.2 E.U./dL
pH, UA: 7.5 (ref 5.0–8.0)

## 2020-03-27 MED ORDER — BCG LIVE 50 MG IS SUSR
3.2400 mL | Freq: Once | INTRAVESICAL | Status: AC
  Administered 2020-03-27: 09:00:00 81 mg via INTRAVESICAL

## 2020-03-27 NOTE — Progress Notes (Signed)
BCG Bladder Instillation  BCG # 6 of 6  Due to Bladder Cancer patient is present today for a BCG treatment. Patient was cleaned and prepped in a sterile fashion with betadine. A 14 FR catheter was inserted, urine return was noted 10 ml, urine was yellow in color.  81ml of reconstituted BCG was instilled into the bladder. The catheter was then removed. Patient tolerated well, no complications were noted  Preformed by: Abagael Kramm LPN   Follow up/ Additional notes: Keep scheduled OV with cysto

## 2020-04-22 ENCOUNTER — Telehealth: Payer: Self-pay | Admitting: Urology

## 2020-04-22 NOTE — Telephone Encounter (Signed)
Pt called and asked if [name] wants him to continue taking the citric acid liquid. If so, he needs a refill.

## 2020-04-23 ENCOUNTER — Other Ambulatory Visit: Payer: Self-pay

## 2020-04-23 DIAGNOSIS — N210 Calculus in bladder: Secondary | ICD-10-CM

## 2020-04-23 MED ORDER — POTASSIUM CITRATE-CITRIC ACID 1100-334 MG/5ML PO SOLN: 10.0000 meq | Freq: Three times a day (TID) | ORAL | 1 refills | Status: DC

## 2020-04-23 NOTE — Telephone Encounter (Signed)
Refill submitted. 

## 2020-04-23 NOTE — Telephone Encounter (Signed)
Will pt continue to below?

## 2020-04-23 NOTE — Telephone Encounter (Signed)
Please refill prn 

## 2020-05-27 ENCOUNTER — Encounter: Payer: Self-pay | Admitting: Gastroenterology

## 2020-06-27 NOTE — Progress Notes (Signed)
Subjective:  1. History of bladder cancer   2. Bladder stones   3. History of prostate cancer   4. Postprocedural membranous urethral stricture, male     Jack Mcdonald returns today in f/u.  On 01/11/20 He had cystoscopy with removal of dystrophic calcifications and dilation of a membranous stricture.  He has a good stream.  He has reduced urgency but he does have frequency with 100oz water intake daily.   He has no hematuria.   He got induction BCG with the last on 03/27/20.   He had some mild irritation but no flu like symptoms or fever.   He has had no hematuria or dysuria.  He is very pleased with his symptoms.  He has some reduction of the stream but did have a stricture.  He will have some intermittency. HIs UA is clear today.   He remains on poly citra and tamsulosin.  He has a history of prostate cancer and needs a PSA which he will have done soon.   .  IPSS    Row Name 06/28/20 0900         International Prostate Symptom Score   How often have you had the sensation of not emptying your bladder? About half the time     How often have you had to urinate less than every two hours? More than half the time     How often have you found you stopped and started again several times when you urinated? More than half the time     How often have you found it difficult to postpone urination? Less than 1 in 5 times     How often have you had a weak urinary stream? More than half the time     How often have you had to strain to start urination? Not at All     How many times did you typically get up at night to urinate? 2 Times     Total IPSS Score 18       Quality of Life due to urinary symptoms   If you were to spend the rest of your life with your urinary condition just the way it is now how would you feel about that? Mostly Satisfied             ROS:  ROS:  A complete review of systems was performed.  All systems are negative except for pertinent findings as noted.   Review of Systems   Genitourinary: Positive for frequency.  All other systems reviewed and are negative.   No Known Allergies  Outpatient Encounter Medications as of 06/28/2020  Medication Sig  . citric acid-potassium citrate (POLYCITRA) 1100-334 MG/5ML solution Take 5 mLs (10 mEq total) by mouth 3 (three) times daily.  . hydrochlorothiazide (MICROZIDE) 12.5 MG capsule Take 12.5 mg by mouth daily.   . metoprolol tartrate (LOPRESSOR) 25 MG tablet Take 12.5 mg by mouth 2 (two) times daily.   . tamsulosin (FLOMAX) 0.4 MG CAPS capsule Take 1 capsule (0.4 mg total) by mouth daily. (Patient taking differently: Take 0.4 mg by mouth at bedtime. )  . telmisartan (MICARDIS) 40 MG tablet Take 40 mg by mouth daily.   . [DISCONTINUED] citric acid-potassium citrate (POLYCITRA) 1100-334 MG/5ML solution Take 5 mLs (10 mEq total) by mouth 3 (three) times daily.   Facility-Administered Encounter Medications as of 06/28/2020  Medication  . [COMPLETED] ciprofloxacin (CIPRO) tablet 500 mg  . gemcitabine (GEMZAR) chemo syringe for bladder instillation 2,000 mg  Past Medical History:  Diagnosis Date  . BPH with urinary obstruction    urologist--- dr Jeffie Pollock  . Dysphagia    01-05-2020  per pt states chews well and small bites  . GERD (gastroesophageal reflux disease)   . History of external beam radiation therapy    prostate cancer--- completed 2014  . History of kidney stones   . History of prostate cancer urologist--- dr Jeffie Pollock   dx 02-21-2013   Stage T2a, Gleason 3+4;   completed radiation  . Hypertension    followed by cardiology-- Dr Carrolyn Leigh ([place])   per cardiololgy note pt had ETT 03/ 2018 negative for ishcemia  . Moderate aortic regurgitation    per 06-24-19 echo dr Lennette Bihari [facility]  (scanned in media in epic)  . Nocturia   . Palpitations    followed by cardiologist--- dr Raliegh Ip. Gibson Ramp;  per dr Gibson Ramp note , holter monior 01/ 2018 infrequent supraventricular  and ventricular ectopic activity  . Prostate calculus   . 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 Urethral stricture   . Wears glasses     Past Surgical History:  Procedure Laterality Date  . COLONOSCOPY  age 54   few polyps  . CYSTOSCOPY WITH URETHRAL DILATATION N/A 01/11/2020   Procedure: CYSTOSCOPY WITH URETHRAL DILATATION AND LITHOLAPAXY;  Surgeon: Irine Seal, MD;  Location: [facility];  Service: Urology;  Laterality: N/A;  . PROSTATE BIOPSY  01/04/13   Adenocarncinoma  . TRANSURETHRAL RESECTION OF BLADDER TUMOR WITH MITOMYCIN-C Bilateral 11/09/2019   Procedure: TRANSURETHRAL RESECTION OF BLADDER TUMOR;  Surgeon: Irine Seal, MD;  Location: [facility];  Service: Urology;  Laterality: Bilateral;  . TRANSURETHRAL RESECTION OF PROSTATE N/A 01/11/2020   Procedure: TRANSURETHRAL RESECTION REMOVAL OF STONES;  Surgeon: Irine Seal, MD;  Location: [facility];  Service: Urology;  Laterality: N/A;    Social History   Socioeconomic History  . Marital status: Married    Spouse name: Not on file  . Number of children: 1  . Years of education: Not on file  . Highest education level: Not on file  Occupational History  . Occupation: Painter  Tobacco Use  . Smoking status: Never Smoker  . Smokeless tobacco: Never Used  Vaping Use  . Vaping Use: Never used  Substance and Sexual Activity  . Alcohol use: Yes    Comment: 2 per day mixed drinks  . Drug use: No  . Sexual activity: Yes  Other Topics Concern  . Not on file  Social History Narrative  . Not on file   Social Determinants of Health   Financial Resource Strain:   . Difficulty of Paying Living Expenses:   Food Insecurity:   . Worried About Charity fundraiser in the Last Year:   . Arboriculturist in the Last Year:   Transportation Needs:   . Film/video editor (Medical):   [name] Lack of Transportation (Non-Medical):    Physical Activity:   . Days of Exercise per Week:   . Minutes of Exercise per Session:   Stress:   . Feeling of Stress :   Social Connections:   . Frequency of Communication with Friends and Family:   . Frequency of Social Gatherings with Friends and Family:   . Attends Religious Services:   . Active Member of Clubs or Organizations:   . Attends Archivist Meetings:   .  Marital Status:   Intimate Partner Violence:   . Fear of Current or Ex-Partner:   . Emotionally Abused:   [name] Physically Abused:   . Sexually Abused:     Family History  Problem Relation Age of Onset  . Colon cancer Neg Hx   . Colon polyps Neg Hx   . Rectal cancer Neg Hx   . Stomach cancer Neg Hx        Objective: Vitals:   06/28/20 0944  BP: 122/75  Pulse: 72  Temp: 98.4 F (36.9 C)     Physical Exam  Lab Results:  Results for orders placed or performed in visit on 06/28/20 (from the past 24 hour(s))  Urinalysis, Routine w reflex microscopic     Status: Abnormal   Collection Time: 06/28/20  9:41 AM  Result Value Ref Range   Specific Gravity, UA 1.020 1.005 - 1.030   pH, UA 8.5 (H) 5.0 - 7.5   Color, UA Yellow Yellow   Appearance Ur Clear Clear   Leukocytes,UA Negative Negative   Protein,UA Trace (A) Negative/Trace   Glucose, UA Negative Negative   Ketones, UA Negative Negative   RBC, UA Negative Negative   Bilirubin, UA Negative Negative   Urobilinogen, Ur 1.0 0.2 - 1.0 mg/dL   Nitrite, UA Negative Negative   Microscopic Examination Comment    Narrative   Performed at:  [facility] [address] [place], [place]  [id number] Lab Director: Mina Marble MT, Phone:  [phone]    BMET No results for input(s): NA, K, CL, CO2, GLUCOSE, BUN, CREATININE, CALCIUM in the last 72 hours. PSA No results found for: PSA No results found for: TESTOSTERONE  Path report is pending for the stone analysis.   Studies/Results: Cystoscopy:   He was prepped with  betadine, draped with sterile towels and the urethra was instilled with 2% lidocaine.  He was given cipro to cover the procedure.  Urethra: normal with the exception of a mild membranous stricture. Prostate: Bilobar hyperplasia with blanching of the mucosa on the right with some keratinization but no significant recurrent stones.  Edema at the left bladder neck. Bladder: smooth without mucosal lesions.  Ureteral orifices: normal  Complications: none.     Assessment & Plan: Membranous stricture.  His stream is down but the stricture is mild and opened easily with cystoscopy.  Bladder and prostatic urethral dystrophic calcification have resolved with removal and medical therapy.[name]   He will continue the high fluid intake and polycitra.  History of bladder cancer.   He will begin BCG maintenance next week for 3 weeks.  Cystoscopy in 3 months.  He will then need maintenance 6 months later with cystoscopy.  History of prostate cancer.  PSA today.     Meds ordered this encounter  Medications  . ciprofloxacin (CIPRO) tablet 500 mg  . citric acid-potassium citrate (POLYCITRA) 1100-334 MG/5ML solution    Sig: Take 5 mLs (10 mEq total) by mouth 3 (three) times daily.    Dispense:  473 mL    Refill:  11    Please do not fill the potassium citrate tablet script.   He doesn't need that.     Orders Placed This Encounter  Procedures  . Urinalysis, Routine w reflex microscopic  . PSA      Return for He needs BCG weekly x 3 starting next week and then cysto in 3 months. .   CC: Macon Large, MD      Jenny Reichmann  Jeffie Pollock 06/28/2020

## 2020-06-28 ENCOUNTER — Ambulatory Visit (INDEPENDENT_AMBULATORY_CARE_PROVIDER_SITE_OTHER): Payer: Medicare Other | Admitting: Urology

## 2020-06-28 ENCOUNTER — Other Ambulatory Visit: Payer: Self-pay

## 2020-06-28 ENCOUNTER — Encounter: Payer: Self-pay | Admitting: Urology

## 2020-06-28 VITALS — BP 122/75 | HR 72 | Temp 98.4°F | Ht 70.0 in | Wt 200.0 lb

## 2020-06-28 DIAGNOSIS — N99112 Postprocedural membranous urethral stricture: Secondary | ICD-10-CM | POA: Diagnosis not present

## 2020-06-28 DIAGNOSIS — Z8551 Personal history of malignant neoplasm of bladder: Secondary | ICD-10-CM | POA: Diagnosis not present

## 2020-06-28 DIAGNOSIS — Z8546 Personal history of malignant neoplasm of prostate: Secondary | ICD-10-CM | POA: Diagnosis not present

## 2020-06-28 DIAGNOSIS — N210 Calculus in bladder: Secondary | ICD-10-CM | POA: Insufficient documentation

## 2020-06-28 LAB — URINALYSIS, ROUTINE W REFLEX MICROSCOPIC
Bilirubin, UA: NEGATIVE
Glucose, UA: NEGATIVE
Ketones, UA: NEGATIVE
Leukocytes,UA: NEGATIVE
Nitrite, UA: NEGATIVE
RBC, UA: NEGATIVE
Specific Gravity, UA: 1.02 (ref 1.005–1.030)
Urobilinogen, Ur: 1 mg/dL (ref 0.2–1.0)
pH, UA: 8.5 — ABNORMAL HIGH (ref 5.0–7.5)

## 2020-06-28 MED ORDER — CIPROFLOXACIN HCL 500 MG PO TABS
500.0000 mg | ORAL_TABLET | Freq: Once | ORAL | Status: AC
  Administered 2020-06-28: 500 mg via ORAL

## 2020-06-28 MED ORDER — POTASSIUM CITRATE-CITRIC ACID 1100-334 MG/5ML PO SOLN: 10.0000 meq | Freq: Three times a day (TID) | ORAL | 11 refills | Status: DC

## 2020-06-28 NOTE — Progress Notes (Signed)
Urological Symptom Review  Patient is experiencing the following symptoms: Get up at night to urinate Weak stream   Review of Systems  Gastrointestinal (upper)  : Negative for upper GI symptoms  Gastrointestinal (lower) : Negative for lower GI symptoms  Constitutional : Negative for symptoms  Skin: Negative for skin symptoms  Eyes: Negative for eye symptoms  Ear/Nose/Throat : Negative for Ear/Nose/Throat symptoms  Hematologic/Lymphatic: Negative for Hematologic/Lymphatic symptoms  Cardiovascular : Negative for cardiovascular symptoms  Respiratory : Negative for respiratory symptoms  Endocrine: Negative for endocrine symptoms  Musculoskeletal: Negative for musculoskeletal symptoms  Neurological: Negative for neurological symptoms  Psychologic: Negative for psychiatric symptoms  

## 2020-06-29 LAB — PSA: Prostate Specific Ag, Serum: 1 ng/mL (ref 0.0–4.0)

## 2020-07-01 ENCOUNTER — Telehealth: Payer: Self-pay

## 2020-07-01 ENCOUNTER — Other Ambulatory Visit: Payer: Self-pay

## 2020-07-01 DIAGNOSIS — Z8551 Personal history of malignant neoplasm of bladder: Secondary | ICD-10-CM

## 2020-07-01 DIAGNOSIS — Z8546 Personal history of malignant neoplasm of prostate: Secondary | ICD-10-CM

## 2020-07-01 NOTE — Telephone Encounter (Signed)
Notified pt of PSA result and appt for labwork.

## 2020-07-01 NOTE — Telephone Encounter (Signed)
-----   Message from Irine Seal, MD sent at 07/01/2020  8:37 AM EDT ----- His PSA is 1.   Previously it had been stable between 0.6 and 0.7 but the current value is with a new lab so I would just like to get it repeated prior to his f/u in November.  ----- Message ----- From: Iris Pert, LPN Sent: 4/0/9811   8:18 AM EDT To: Irine Seal, MD  Please review

## 2020-07-05 ENCOUNTER — Other Ambulatory Visit: Payer: Self-pay

## 2020-07-05 ENCOUNTER — Ambulatory Visit (INDEPENDENT_AMBULATORY_CARE_PROVIDER_SITE_OTHER): Payer: Medicare Other

## 2020-07-05 DIAGNOSIS — C678 Malignant neoplasm of overlapping sites of bladder: Secondary | ICD-10-CM | POA: Diagnosis not present

## 2020-07-05 LAB — URINALYSIS, ROUTINE W REFLEX MICROSCOPIC
Bilirubin, UA: NEGATIVE
Glucose, UA: NEGATIVE
Leukocytes,UA: NEGATIVE
Nitrite, UA: NEGATIVE
RBC, UA: NEGATIVE
Specific Gravity, UA: 1.025 (ref 1.005–1.030)
Urobilinogen, Ur: 1 mg/dL (ref 0.2–1.0)
pH, UA: 6.5 (ref 5.0–7.5)

## 2020-07-05 LAB — MICROSCOPIC EXAMINATION
Bacteria, UA: NONE SEEN
Epithelial Cells (non renal): NONE SEEN /hpf (ref 0–10)
Renal Epithel, UA: NONE SEEN /hpf

## 2020-07-05 MED ORDER — BCG LIVE 50 MG IS SUSR
3.2400 mL | Freq: Once | INTRAVESICAL | Status: AC
  Administered 2020-07-05: 81 mg via INTRAVESICAL

## 2020-07-05 NOTE — Patient Instructions (Signed)

## 2020-07-05 NOTE — Progress Notes (Signed)
BCG Bladder Instillation  BCG # 1 of 3  Due to Bladder Cancer patient is present today for a BCG treatment. Patient was cleaned and prepped in a sterile fashion with betadine. A 14FR catheter was inserted, urine return was noted 49ml, urine was yellow in color.  53ml of reconstituted BCG was instilled into the bladder. The catheter was then removed. Patient tolerated well, no complications were noted  Performed by: Tamara Monteith,LPn  Follow up/ Additional notes: Keep next scheduled NV

## 2020-07-12 ENCOUNTER — Ambulatory Visit (INDEPENDENT_AMBULATORY_CARE_PROVIDER_SITE_OTHER): Payer: Medicare Other

## 2020-07-12 ENCOUNTER — Other Ambulatory Visit: Payer: Self-pay

## 2020-07-12 DIAGNOSIS — C678 Malignant neoplasm of overlapping sites of bladder: Secondary | ICD-10-CM | POA: Diagnosis not present

## 2020-07-12 DIAGNOSIS — Z8551 Personal history of malignant neoplasm of bladder: Secondary | ICD-10-CM | POA: Diagnosis not present

## 2020-07-12 LAB — URINALYSIS, ROUTINE W REFLEX MICROSCOPIC
Bilirubin, UA: NEGATIVE
Glucose, UA: NEGATIVE
Ketones, UA: NEGATIVE
Leukocytes,UA: NEGATIVE
Nitrite, UA: NEGATIVE
RBC, UA: NEGATIVE
Specific Gravity, UA: 1.025 (ref 1.005–1.030)
Urobilinogen, Ur: 2 mg/dL — ABNORMAL HIGH (ref 0.2–1.0)
pH, UA: 7 (ref 5.0–7.5)

## 2020-07-12 LAB — MICROSCOPIC EXAMINATION
Bacteria, UA: NONE SEEN
Epithelial Cells (non renal): NONE SEEN /hpf (ref 0–10)
Renal Epithel, UA: NONE SEEN /hpf

## 2020-07-12 MED ORDER — BCG LIVE 50 MG IS SUSR
3.2400 mL | Freq: Once | INTRAVESICAL | Status: AC
  Administered 2020-07-12: 81 mg via INTRAVESICAL

## 2020-07-12 NOTE — Progress Notes (Signed)
BCG Bladder Instillation  BCG # 2   Due to Bladder Cancer patient is present today for a BCG treatment. Patient was cleaned and prepped in a sterile fashion with betadine. A 14FR catheter was inserted, urine return was noted 19ml, urine was yellow in color.  51ml of reconstituted BCG was instilled into the bladder. The catheter was then removed. Patient tolerated well, no complications were noted  Performed by: Veretta Sabourin,LPN  Follow up/ Additional notes: Keep next NV

## 2020-07-19 ENCOUNTER — Other Ambulatory Visit: Payer: Self-pay

## 2020-07-19 ENCOUNTER — Ambulatory Visit (INDEPENDENT_AMBULATORY_CARE_PROVIDER_SITE_OTHER): Payer: Medicare Other

## 2020-07-19 DIAGNOSIS — C678 Malignant neoplasm of overlapping sites of bladder: Secondary | ICD-10-CM

## 2020-07-19 LAB — URINALYSIS, ROUTINE W REFLEX MICROSCOPIC
Bilirubin, UA: NEGATIVE
Glucose, UA: NEGATIVE
Ketones, UA: NEGATIVE
Leukocytes,UA: NEGATIVE
Nitrite, UA: NEGATIVE
Specific Gravity, UA: 1.02 (ref 1.005–1.030)
Urobilinogen, Ur: 1 mg/dL (ref 0.2–1.0)
pH, UA: 8.5 — ABNORMAL HIGH (ref 5.0–7.5)

## 2020-07-19 LAB — MICROSCOPIC EXAMINATION
Bacteria, UA: NONE SEEN
Renal Epithel, UA: NONE SEEN /hpf

## 2020-07-19 MED ORDER — BCG LIVE 50 MG IS SUSR
3.2400 mL | Freq: Once | INTRAVESICAL | Status: AC
  Administered 2020-07-19: 40.5 mg via INTRAVESICAL

## 2020-07-19 NOTE — Progress Notes (Signed)
BCG Bladder Instillation  BCG # 3  Due to Bladder Cancer patient is present today for a BCG treatment. Patient was cleaned and prepped in a sterile fashion with betadine. A 14FR catheter was inserted, urine return was noted 44ml, urine was yellow in color.  15ml of reconstituted BCG was instilled into the bladder. The catheter was then removed. Patient tolerated well, no complications were noted  Performed by: Genevieve Ritzel,LPN  Follow up/ Additional notes: Keep next scheduled OV

## 2020-07-22 ENCOUNTER — Ambulatory Visit (INDEPENDENT_AMBULATORY_CARE_PROVIDER_SITE_OTHER): Payer: Medicare Other | Admitting: Gastroenterology

## 2020-07-22 ENCOUNTER — Encounter: Payer: Self-pay | Admitting: Gastroenterology

## 2020-07-22 VITALS — BP 112/60 | HR 74 | Ht 67.0 in | Wt 207.0 lb

## 2020-07-22 DIAGNOSIS — R1310 Dysphagia, unspecified: Secondary | ICD-10-CM

## 2020-07-22 DIAGNOSIS — Z86010 Personal history of colonic polyps: Secondary | ICD-10-CM | POA: Diagnosis not present

## 2020-07-22 MED ORDER — NA SULFATE-K SULFATE-MG SULF 17.5-3.13-1.6 GM/177ML PO SOLN: 1.0000 | Freq: Once | ORAL | 0 refills | Status: AC

## 2020-07-22 NOTE — Patient Instructions (Signed)
You have been scheduled for an endoscopy and colonoscopy. Please follow the written instructions given to you at your visit today. Please pick up your prep supplies at the pharmacy within the next 1-3 days. If you use inhalers (even only as needed), please bring them with you on the day of your procedure.  Thank you for choosing me and [facility].  Malcolm T. Stark, Jr., MD., FACG   

## 2020-07-22 NOTE — Progress Notes (Addendum)
History of Present Illness: This is a 64 year old male referred by Carrolyn Leigh, MD for the evaluation of dysphagia.  He relates a 2-year history of solid food dysphagia that has not progressed in severity.  He states he chews his food very thoroughly.  Once or twice a months he notices rice chicken and occasionally pills causing difficulty swallowing.  He has induced vomiting on several occasions.  He relates a history of reflux symptoms with night time heartburn and regurgitation in the past however these symptoms have not been active recently. Denies weight loss, abdominal pain, constipation, diarrhea, change in stool caliber, melena, hematochezia, nausea, vomiting, chest pain.    No Known Allergies Outpatient Medications Prior to Visit  Medication Sig Dispense Refill  . citric acid-potassium citrate (POLYCITRA) 1100-334 MG/5ML solution Take 5 mLs (10 mEq total) by mouth 3 (three) times daily. 473 mL 11  . hydrochlorothiazide (MICROZIDE) 12.5 MG capsule Take 12.5 mg by mouth daily.     . metoprolol tartrate (LOPRESSOR) 25 MG tablet Take 12.5 mg by mouth 2 (two) times daily.     . tamsulosin (FLOMAX) 0.4 MG CAPS capsule Take 1 capsule (0.4 mg total) by mouth daily. (Patient taking differently: Take 0.4 mg by mouth at bedtime. ) 90 capsule 3  . telmisartan (MICARDIS) 40 MG tablet Take 40 mg by mouth daily.      Facility-Administered Medications Prior to Visit  Medication Dose Route Frequency Provider Last Rate Last Admin  . gemcitabine (GEMZAR) chemo syringe for bladder instillation 2,000 mg  2,000 mg Bladder Instillation Once Irine Seal, MD       Past Medical History:  Diagnosis Date  . Bladder cancer ([place])    imm therapy   . BPH with urinary obstruction    urologist--- dr Jeffie Pollock  . Dysphagia    01-05-2020  per pt states chews well and small bites  . GERD (gastroesophageal reflux disease)   . History of external beam radiation therapy    prostate cancer--- completed 2014  .  History of kidney stones   . History of prostate cancer urologist--- dr Jeffie Pollock   dx 02-21-2013   Stage T2a, Gleason 3+4;   completed radiation  . Hypertension    followed by cardiology-- Dr Carrolyn Leigh ([place])   per cardiololgy note pt had ETT 03/ 2018 negative for ishcemia  . Moderate aortic regurgitation    per 06-24-19 echo dr Lennette Bihari [facility]  (scanned in media in epic)  . Nocturia   . Palpitations    followed by cardiologist--- dr Raliegh Ip. Gibson Ramp;  per dr Gibson Ramp note , holter monior 01/ 2018 infrequent supraventricular and ventricular ectopic activity  . Prostate calculus   . Prostate cancer ([place]) 01/04/13   Adenocarcinoma,gleason=3+4=7,& 3+3=6,PSA=3.94,Volume=63cc  . S/P TURP   . Tubular adenoma of colon 2017  . Urethral stricture   . Wears glasses    Past Surgical History:  Procedure Laterality Date  . COLONOSCOPY  age 34   few polyps  . CYSTOSCOPY WITH URETHRAL DILATATION N/A 01/11/2020   Procedure: CYSTOSCOPY WITH URETHRAL DILATATION AND LITHOLAPAXY;  Surgeon: Irine Seal, MD;  Location: [facility];  Service: Urology;  Laterality: N/A;  . PROSTATE BIOPSY  01/04/13   Adenocarncinoma  . TRANSURETHRAL RESECTION OF BLADDER TUMOR WITH MITOMYCIN-C Bilateral 11/09/2019   Procedure: TRANSURETHRAL RESECTION OF BLADDER TUMOR;  Surgeon: Irine Seal, MD;  Location: [facility];  Service: Urology;  Laterality: Bilateral;  .  TRANSURETHRAL RESECTION OF PROSTATE N/A 01/11/2020   Procedure: TRANSURETHRAL RESECTION REMOVAL OF STONES;  Surgeon: Irine Seal, MD;  Location: [facility];  Service: Urology;  Laterality: N/A;   Social History   Socioeconomic History  . Marital status: Married    Spouse name: Not on file  . Number of children: 1  . Years of education: Not on file  . Highest education level: Not on file  Occupational History  . Occupation: Painter  Tobacco Use  . Smoking status: Never  Smoker  . Smokeless tobacco: Never Used  Vaping Use  . Vaping Use: Never used  Substance and Sexual Activity  . Alcohol use: Yes    Comment: 2 per day mixed drinks  . Drug use: No  . Sexual activity: Yes  Other Topics Concern  . Not on file  Social History Narrative  . Not on file   Social Determinants of Health   Financial Resource Strain:   . Difficulty of Paying Living Expenses: Not on file  Food Insecurity:   . Worried About Charity fundraiser in the Last Year: Not on file  . Ran Out of Food in the Last Year: Not on file  Transportation Needs:   . Lack of Transportation (Medical): Not on file  . Lack of Transportation (Non-Medical): Not on file  Physical Activity:   . Days of Exercise per Week: Not on file  . Minutes of Exercise per Session: Not on file  Stress:   . Feeling of Stress : Not on file  Social Connections:   . Frequency of Communication with Friends and Family: Not on file  . Frequency of Social Gatherings with Friends and Family: Not on file  . Attends Religious Services: Not on file  . Active Member of Clubs or Organizations: Not on file  . Attends Archivist Meetings: Not on file  . Marital Status: Not on file   Family History  Problem Relation Age of Onset  . Colon cancer Neg Hx   . Colon polyps Neg Hx   . Rectal cancer Neg Hx   . Stomach cancer Neg Hx   . Pancreatic cancer Neg Hx   . Esophageal cancer Neg Hx       Review of Systems: Pertinent positive and negative review of systems were noted in the above HPI section. All other review of systems were otherwise negative.   Physical Exam: General: Well developed, well nourished, no acute distress Head: Normocephalic and atraumatic Eyes:  sclerae anicteric, EOMI Ears: Normal auditory acuity Mouth: Not examined, mask on during Covid-19 pandemic Neck: Supple, no masses or thyromegaly Lungs: Clear throughout to auscultation Heart: Regular rate and rhythm; no murmurs, rubs or  bruits Abdomen: Soft, non tender and non distended. No masses, hepatosplenomegaly or hernias noted. Normal Bowel sounds Rectal: Deferred to colonoscopy Musculoskeletal: Symmetrical with no gross deformities  Skin: No lesions on visible extremities Pulses:  Normal pulses noted Extremities: No clubbing, cyanosis, edema or deformities noted Neurological: Alert oriented x 4, grossly nonfocal Cervical Nodes:  No significant cervical adenopathy Inguinal Nodes: No significant inguinal adenopathy Psychological:  Alert and cooperative. Normal mood and affect   Assessment and Recommendations:  1.  Solid food and pill dysphagia.  Suspected GERD and esophageal stricture.  Rule out eosinophilic esophagitis and much less likely neoplasm.  Avoid foods and pills that cause dysphagia until endoscopy completed.  Schedule EGD with possible dilation. The risks (including bleeding, perforation, infection, missed lesions, medication reactions and possible  hospitalization or surgery if complications occur), benefits, and alternatives to endoscopy with possible biopsy and possible dilation were discussed with the patient and they consent to proceed.   2. Personal history of adenomatous colon polyps.  He is due for surveillance colonoscopy next year however he would like to schedule this at the time of his EGD. The risks (including bleeding, perforation, infection, missed lesions, medication reactions and possible hospitalization or surgery if complications occur), benefits, and alternatives to colonoscopy with possible biopsy and possible polypectomy were discussed with the patient and they consent to proceed.     cc: Carrolyn Leigh, MD

## 2020-09-25 ENCOUNTER — Ambulatory Visit (AMBULATORY_SURGERY_CENTER): Payer: Medicare Other | Admitting: Gastroenterology

## 2020-09-25 ENCOUNTER — Encounter: Payer: Self-pay | Admitting: Gastroenterology

## 2020-09-25 ENCOUNTER — Other Ambulatory Visit: Payer: Self-pay

## 2020-09-25 VITALS — BP 136/70 | HR 72 | Temp 97.1°F | Resp 22 | Ht 67.0 in | Wt 207.0 lb

## 2020-09-25 DIAGNOSIS — K3189 Other diseases of stomach and duodenum: Secondary | ICD-10-CM | POA: Diagnosis not present

## 2020-09-25 DIAGNOSIS — D123 Benign neoplasm of transverse colon: Secondary | ICD-10-CM

## 2020-09-25 DIAGNOSIS — K319 Disease of stomach and duodenum, unspecified: Secondary | ICD-10-CM | POA: Diagnosis not present

## 2020-09-25 DIAGNOSIS — R1310 Dysphagia, unspecified: Secondary | ICD-10-CM

## 2020-09-25 DIAGNOSIS — K2100 Gastro-esophageal reflux disease with esophagitis, without bleeding: Secondary | ICD-10-CM

## 2020-09-25 DIAGNOSIS — Z86010 Personal history of colonic polyps: Secondary | ICD-10-CM | POA: Diagnosis not present

## 2020-09-25 DIAGNOSIS — K2980 Duodenitis without bleeding: Secondary | ICD-10-CM | POA: Diagnosis not present

## 2020-09-25 DIAGNOSIS — K222 Esophageal obstruction: Secondary | ICD-10-CM | POA: Diagnosis not present

## 2020-09-25 MED ORDER — SODIUM CHLORIDE 0.9 % IV SOLN: 500.0000 mL | INTRAVENOUS | Status: DC

## 2020-09-25 MED ORDER — PANTOPRAZOLE SODIUM 40 MG PO TBEC: 40.0000 mg | DELAYED_RELEASE_TABLET | Freq: Every day | ORAL | 3 refills | Status: DC

## 2020-09-25 NOTE — Patient Instructions (Signed)
Please read handouts provided. Continue present medications. Await pathology results. Begin pantoprazole 40 mg daily. Return to GI office in 6 weeks.  Antireflux measures. Clear liquid diet for 2 hours, then advance as tolerated to soft diet today. Resume prior diet tomorrow. High Fiber Diet.      YOU HAD AN ENDOSCOPIC PROCEDURE TODAY AT [facility]:   Refer to the procedure report that was given to you for any specific questions about what was found during the examination.  If the procedure report does not answer your questions, please call your gastroenterologist to clarify.  If you requested that your care partner not be given the details of your procedure findings, then the procedure report has been included in a sealed envelope for you to review at your convenience later.  YOU SHOULD EXPECT: Some feelings of bloating in the abdomen. Passage of more gas than usual.  Walking can help get rid of the air that was put into your GI tract during the procedure and reduce the bloating. If you had a lower endoscopy (such as a colonoscopy or flexible sigmoidoscopy) you may notice spotting of blood in your stool or on the toilet paper. If you underwent a bowel prep for your procedure, you may not have a normal bowel movement for a few days.  Please Note:  You might notice some irritation and congestion in your nose or some drainage.  This is from the oxygen used during your procedure.  There is no need for concern and it should clear up in a day or so.  SYMPTOMS TO REPORT IMMEDIATELY:   Following lower endoscopy (colonoscopy or flexible sigmoidoscopy):  Excessive amounts of blood in the stool  Significant tenderness or worsening of abdominal pains  Swelling of the abdomen that is new, acute  Fever of 100F or higher   Following upper endoscopy (EGD)  Vomiting of blood or coffee ground material  New chest pain or pain under the shoulder blades  Painful or persistently  difficult swallowing  New shortness of breath  Fever of 100F or higher  Black, tarry-looking stools  For urgent or emergent issues, a gastroenterologist can be reached at any hour by calling [phone]. Do not use MyChart messaging for urgent concerns.    DIET:   Drink plenty of fluids but you should avoid alcoholic beverages for 24 hours.  ACTIVITY:  You should plan to take it easy for the rest of today and you should NOT DRIVE or use heavy machinery until tomorrow (because of the sedation medicines used during the test).    FOLLOW UP: Our staff will call the number listed on your records 48-72 hours following your procedure to check on you and address any questions or concerns that you may have regarding the information given to you following your procedure. If we do not reach you, we will leave a message.  We will attempt to reach you two times.  During this call, we will ask if you have developed any symptoms of COVID 19. If you develop any symptoms (ie: fever, flu-like symptoms, shortness of breath, cough etc.) before then, please call [phone].  If you test positive for Covid 19 in the 2 weeks post procedure, please call and report this information to [place].    If any biopsies were taken you will be contacted by phone or by letter within the next 1-3 weeks.  Please call us at [phone] if you have not heard about the biopsies in 3 weeks.  SIGNATURES/CONFIDENTIALITY: You and/or your care partner have signed paperwork which will be entered into your electronic medical record.  These signatures attest to the fact that that the information above on your After Visit Summary has been reviewed and is understood.  Full responsibility of the confidentiality of this discharge information lies with you and/or your care-partner.

## 2020-09-25 NOTE — Op Note (Signed)
[facility] Patient Name: Jack Mcdonald Procedure Date: 09/25/2020 10:48 AM MRN: [id number] Endoscopist: Ladene Artist , MD Age: 63 Referring MD:  Date of Birth: 11/08/1956 Gender: Male Account #: [id number] Procedure:                Colonoscopy Indications:              Surveillance: Personal history of adenomatous                            polyps on last colonoscopy > 3 years ago Medicines:                Monitored Anesthesia Care Procedure:                Pre-Anesthesia Assessment:                           - Prior to the procedure, a History and Physical                            was performed, and patient medications and                            allergies were reviewed. The patient's tolerance of                            previous anesthesia was also reviewed. The risks                            and benefits of the procedure and the sedation                            options and risks were discussed with the patient.                            All questions were answered, and informed consent                            was obtained. Prior Anticoagulants: The patient has                            taken no previous anticoagulant or antiplatelet                            agents. ASA Grade Assessment: III - A patient with                            severe systemic disease. After reviewing the risks                            and benefits, the patient was deemed in                            satisfactory condition to undergo the procedure.  After obtaining informed consent, the colonoscope                            was passed under direct vision. Throughout the                            procedure, the patient's blood pressure, pulse, and                            oxygen saturations were monitored continuously. The                            Colonoscope was introduced through the anus and                            advanced to the the  cecum, identified by                            appendiceal orifice and ileocecal valve. The                            ileocecal valve, appendiceal orifice, and rectum                            were photographed. The quality of the bowel                            preparation was excellent. The colonoscopy was                            performed without difficulty. The patient tolerated                            the procedure well. Scope In: 10:57:48 AM Scope Out: 11:08:28 AM Scope Withdrawal Time: 0 hours 9 minutes 18 seconds  Total Procedure Duration: 0 hours 10 minutes 40 seconds  Findings:                 The perianal and digital rectal examinations were                            normal.                           A 7 mm polyp was found in the transverse colon. The                            polyp was sessile. The polyp was removed with a                            cold snare. Resection and retrieval were complete.                           A few small-mouthed diverticula were found in the  left colon. There was no evidence of diverticular                            bleeding.                           Internal hemorrhoids were found during                            retroflexion. The hemorrhoids were small and Grade                            I (internal hemorrhoids that do not prolapse).                           The exam was otherwise without abnormality on                            direct and retroflexion views. Complications:            No immediate complications. Estimated blood loss:                            None. Estimated Blood Loss:     Estimated blood loss: none. Impression:               - One 7 mm polyp in the transverse colon, removed                            with a cold snare. Resected and retrieved.                           - Mild diverticulosis in the left colon.                           - Internal hemorrhoids.                            - The examination was otherwise normal on direct                            and retroflexion views. Recommendation:           - Repeat colonoscopy after studies are complete for                            surveillance, likely 7 years, based on pathology                            results.                           - Patient has a contact number available for                            emergencies. The signs and symptoms of potential  delayed complications were discussed with the                            patient. Return to normal activities tomorrow.                            Written discharge instructions were provided to the                            patient.                           - High fiber diet.                           - Continue present medications.                           - Await pathology results. Ladene Artist, MD 09/25/2020 11:26:17 AM This report has been signed electronically.

## 2020-09-25 NOTE — Progress Notes (Signed)
Report given to PACU, vss 

## 2020-09-25 NOTE — Progress Notes (Signed)
1050 Robinul 0.1 mg IV given due large amount of secretions upon assessment.  MD made aware, vss  

## 2020-09-25 NOTE — Progress Notes (Signed)
Called to room to assist during endoscopic procedure.  Patient ID and intended procedure confirmed with present staff. Received instructions for my participation in the procedure from the performing physician.  

## 2020-09-25 NOTE — Op Note (Signed)
[facility] Patient Name: Jack Mcdonald Procedure Date: 09/25/2020 10:48 AM MRN: [id number] Endoscopist: Ladene Artist , MD Age: 64 Referring MD:  Date of Birth: 1956-06-23 Gender: Male Account #: [id number] Procedure:                Upper GI endoscopy Indications:              Dysphagia Medicines:                Monitored Anesthesia Care Procedure:                Pre-Anesthesia Assessment:                           - Prior to the procedure, a History and Physical                            was performed, and patient medications and                            allergies were reviewed. The patient's tolerance of                            previous anesthesia was also reviewed. The risks                            and benefits of the procedure and the sedation                            options and risks were discussed with the patient.                            All questions were answered, and informed consent                            was obtained. Prior Anticoagulants: The patient has                            taken no previous anticoagulant or antiplatelet                            agents. ASA Grade Assessment: III - A patient with                            severe systemic disease. After reviewing the risks                            and benefits, the patient was deemed in                            satisfactory condition to undergo the procedure.                           After obtaining informed consent, the endoscope was  passed under direct vision. Throughout the                            procedure, the patient's blood pressure, pulse, and                            oxygen saturations were monitored continuously. The                            Endoscope was introduced through the mouth, and                            advanced to the second part of duodenum. The upper                            GI endoscopy was accomplished without  difficulty.                            The patient tolerated the procedure well. Scope In: Scope Out: Findings:                 LA Grade B (one or more mucosal breaks greater than                            5 mm, not extending between the tops of two mucosal                            folds) esophagitis with no bleeding was found in                            the distal esophagus.                           One benign-appearing, intrinsic severe stenosis was                            found at the gastroesophageal junction. This                            stenosis measured 8 mm (inner diameter) x less than                            one cm (in length). The stenosis was traversed                            after dilation. A guidewire was placed and the                            scope was withdrawn. Dilations were performed with                            Savary dilators with mild resistance at 10 mm, 11  mm, 12 mm, 13 mm. The dilation site was examined                            following endoscope reinsertion and showed moderate                            mucosal disruption and moderate improvement in                            luminal narrowing.                           The exam of the esophagus was otherwise normal.                           Localized mildly erythematous mucosa without                            bleeding was found in the prepyloric region of the                            stomach. Biopsies were taken with a cold forceps                            for histology.                           The exam of the stomach was otherwise normal.                           Two 7 to 8 mm mucosal nodules with a localized                            distribution was found in the duodenal bulb.                            Biopsies were taken with a cold forceps for                            histology.                           The exam of the duodenum was  otherwise normal. Complications:            No immediate complications. Estimated Blood Loss:     Estimated blood loss was minimal. Impression:               - LA Grade B reflux esophagitis with no bleeding.                           - Benign-appearing esophageal stenosis. Dilated.                           - Erythematous mucosa in the prepyloric region of  the stomach. Biopsied.                           - Two mucosal nodules found in the duodenum.                            Biopsied. Recommendation:           - Patient has a contact number available for                            emergencies. The signs and symptoms of potential                            delayed complications were discussed with the                            patient. Return to normal activities tomorrow.                            Written discharge instructions were provided to the                            patient.                           - Clear liquid diet for 2 hours, then advance as                            tolerated to soft diet today.                           - Resume prior diet tomorrow.                           - Antireflux measures.                           - Continue present medications.                           - Pantoprazole 40 mg po qd, 1 year of refills.                           - Await pathology results.                           - Return to GI office in 6 weeks. Ladene Artist, MD 09/25/2020 11:32:43 AM This report has been signed electronically.

## 2020-09-27 ENCOUNTER — Telehealth: Payer: Self-pay | Admitting: *Deleted

## 2020-09-27 NOTE — Telephone Encounter (Signed)
  Follow up Call-  Call back number 09/25/2020  Post procedure Call Back phone  # [phone]  Permission to leave phone message Yes  Some recent data might be hidden     Patient questions:  Do you have a fever, pain , or abdominal swelling? No. Pain Score  0 *  Have you tolerated food without any problems? Yes.    Have you been able to return to your normal activities? Yes.    Do you have any questions about your discharge instructions: Diet   No. Medications  No. Follow up visit  No.  Do you have questions or concerns about your Care? No.  Actions: * If pain score is 4 or above: No action needed, pain <4.  1. Have you developed a fever since your procedure? no  2.   Have you had an respiratory symptoms (SOB or cough) since your procedure? no  3.   Have you tested positive for COVID 19 since your procedure no  4.   Have you had any family members/close contacts diagnosed with the COVID 19 since your procedure?  no   If yes to any of these questions please route to Joylene John, RN and Joella Prince, RN

## 2020-10-07 ENCOUNTER — Encounter: Payer: Self-pay | Admitting: Gastroenterology

## 2020-10-10 ENCOUNTER — Other Ambulatory Visit: Payer: Medicare Other

## 2020-10-10 ENCOUNTER — Other Ambulatory Visit: Payer: Self-pay

## 2020-10-11 LAB — PSA: Prostate Specific Ag, Serum: 0.9 ng/mL (ref 0.0–4.0)

## 2020-10-17 NOTE — Progress Notes (Signed)
Subjective:  1. History of bladder cancer   2. History of prostate cancer   3. Postprocedural membranous urethral stricture, male   4. Rising PSA following treatment for malignant neoplasm of prostate     Jack Mcdonald returns today in f/u.  On 01/11/20 He had cystoscopy with removal of dystrophic calcifications and dilation of a membranous stricture.  He has a good stream.  He has reduced urgency but he does have frequency with 100oz water intake daily.   He has no hematuria.   He got induction BCG with the last on 03/27/20 and then had 3 weeks of maintenance with the last on 07/19/20.  He had bleeding after the treatment on a full dose but did better with 1/2 dose with the last of the 3.   His UA is clear today.  He has an adequate but reduced stream. .   He had some mild irritation but no flu like symptoms or fever.   He has had no hematuria or dysuria.  He is very pleased with his symptoms.  He has some reduction of the stream but did have a stricture.  He will have some intermittency. HIs UA is clear today.   He remains on poly citra and tamsulosin.  He has a history of prostate cancer treated with a seed implant and his PSA on 10/10/20 was 0.9.  It was 1.0 on 06/28/20.  He remains on potassium citrate to prevent recurrent calcium deposition.  He has a new left epididymal lesion.    .    ROS:  ROS:  A complete review of systems was performed.  All systems are negative except for pertinent findings as noted.   Review of Systems    No Known Allergies  Outpatient Encounter Medications as of 10/18/2020  Medication Sig  . citric acid-potassium citrate (POLYCITRA) 1100-334 MG/5ML solution Take 5 mLs (10 mEq total) by mouth 3 (three) times daily.  . hydrochlorothiazide (MICROZIDE) 12.5 MG capsule Take 12.5 mg by mouth daily.   . metoprolol tartrate (LOPRESSOR) 25 MG tablet Take 12.5 mg by mouth 2 (two) times daily.   . pantoprazole (PROTONIX) 40 MG tablet Take 1 tablet (40 mg total) by mouth daily.   . tamsulosin (FLOMAX) 0.4 MG CAPS capsule Take 1 capsule (0.4 mg total) by mouth daily. (Patient taking differently: Take 0.4 mg by mouth at bedtime. )  . telmisartan (MICARDIS) 40 MG tablet Take 40 mg by mouth daily.    Facility-Administered Encounter Medications as of 10/18/2020  Medication  . ciprofloxacin (CIPRO) tablet 500 mg  . gemcitabine (GEMZAR) chemo syringe for bladder instillation 2,000 mg    Past Medical History:  Diagnosis Date  . Bladder cancer ([facility])    imm therapy   . BPH with urinary obstruction    urologist--- dr Jeffie Pollock  . Dysphagia    01-05-2020  per pt states chews well and small bites  . History of external beam radiation therapy    prostate cancer--- completed 2014  . History of kidney stones   . History of prostate cancer urologist--- dr Jeffie Pollock   dx 02-21-2013   Stage T2a, Gleason 3+4;   completed radiation  . Hypertension    followed by cardiology-- Dr Carrolyn Leigh ([place])   per cardiololgy note pt had ETT 03/ 2018 negative for ishcemia  . Moderate aortic regurgitation    per 06-24-19 echo dr Lennette Bihari [facility]  (scanned in media in epic)  . Nocturia   .  Palpitations    followed by cardiologist--- dr Raliegh Ip. Gibson Ramp;  per dr Gibson Ramp note , holter monior 01/ 2018 infrequent supraventricular and ventricular ectopic activity  . Prostate calculus   . Prostate cancer ([facility]) 01/04/13   Adenocarcinoma,gleason=3+4=7,& 3+3=6,PSA=3.94,Volume=63cc  . S/P TURP   . Tubular adenoma of colon 2017  . Urethral stricture   . Wears glasses     Past Surgical History:  Procedure Laterality Date  . COLONOSCOPY  age 17   few polyps  . CYSTOSCOPY WITH URETHRAL DILATATION N/A 01/11/2020   Procedure: CYSTOSCOPY WITH URETHRAL DILATATION AND LITHOLAPAXY;  Surgeon: Irine Seal, MD;  Location: [facility];  Service: Urology;  Laterality: N/A;  . PROSTATE BIOPSY  01/04/13   Adenocarncinoma  . TRANSURETHRAL RESECTION OF  BLADDER TUMOR WITH MITOMYCIN-C Bilateral 11/09/2019   Procedure: TRANSURETHRAL RESECTION OF BLADDER TUMOR;  Surgeon: Irine Seal, MD;  Location: [facility];  Service: Urology;  Laterality: Bilateral;  . TRANSURETHRAL RESECTION OF PROSTATE N/A 01/11/2020   Procedure: TRANSURETHRAL RESECTION REMOVAL OF STONES;  Surgeon: Irine Seal, MD;  Location: [facility];  Service: Urology;  Laterality: N/A;    Social History   Socioeconomic History  . Marital status: Married    Spouse name: Not on file  . Number of children: 1  . Years of education: Not on file  . Highest education level: Not on file  Occupational History  . Occupation: Painter  Tobacco Use  . Smoking status: Never Smoker  . Smokeless tobacco: Never Used  Vaping Use  . Vaping Use: Never used  Substance and Sexual Activity  . Alcohol use: Yes    Comment: 2 per day mixed drinks  . Drug use: No  . Sexual activity: Yes  Other Topics Concern  . Not on file  Social History Narrative  . Not on file   Social Determinants of Health   Financial Resource Strain:   . Difficulty of Paying Living Expenses: Not on file  Food Insecurity:   . Worried About Charity fundraiser in the Last Year: Not on file  . Ran Out of Food in the Last Year: Not on file  Transportation Needs:   . Lack of Transportation (Medical): Not on file  . Lack of Transportation (Non-Medical): Not on file  Physical Activity:   . Days of Exercise per Week: Not on file  . Minutes of Exercise per Session: Not on file  Stress:   . Feeling of Stress : Not on file  Social Connections:   . Frequency of Communication with Friends and Family: Not on file  . Frequency of Social Gatherings with Friends and Family: Not on file  . Attends Religious Services: Not on file  . Active Member of Clubs or Organizations: Not on file  . Attends Archivist Meetings: Not on file  . Marital Status: Not on file  Intimate Partner Violence:    . Fear of Current or Ex-Partner: Not on file  . Emotionally Abused: Not on file  . Physically Abused: Not on file  . Sexually Abused: Not on file    Family History  Problem Relation Age of Onset  . Colon cancer Neg Hx   . Colon polyps Neg Hx   . Rectal cancer Neg Hx   . Stomach cancer Neg Hx   . Pancreatic cancer Neg Hx   . Esophageal cancer Neg Hx        Objective: Vitals:   10/18/20 0924  BP: [name])  155/71  Pulse: 83  Temp: 98.4 F (36.9 C)     Physical Exam Genitourinary:    Comments: He has a 2cm firm non-tender mass of the tail of the epididymis that is most consistent with a BCG granuloma.     Lab Results:  Results for orders placed or performed in visit on 10/18/20 (from the past 24 hour(s))  Urinalysis, Routine w reflex microscopic     Status: Abnormal   Collection Time: 10/18/20  9:25 AM  Result Value Ref Range   Specific Gravity, UA 1.020 1.005 - 1.030   pH, UA 8.5 (H) 5.0 - 7.5   Color, UA Yellow Yellow   Appearance Ur Clear Clear   Leukocytes,UA Negative Negative   Protein,UA Negative Negative/Trace   Glucose, UA Negative Negative   Ketones, UA Negative Negative   RBC, UA Negative Negative   Bilirubin, UA Negative Negative   Urobilinogen, Ur 1.0 0.2 - 1.0 mg/dL   Nitrite, UA Negative Negative   Microscopic Examination Comment    Narrative   Performed at:  [facility] [address], [place], [place]  [id number] Lab Director: [place], Phone:  [phone]    BMET No results for input(s): NA, K, CL, CO2, GLUCOSE, BUN, CREATININE, CALCIUM in the last 72 hours. PSA Recent Results (from the past 2160 hour(s))  PSA     Status: None   Collection Time: 10/10/20  9:16 AM  Result Value Ref Range   Prostate Specific Ag, Serum 0.9 0.0 - 4.0 ng/mL    Comment: Roche ECLIA methodology. According to the American Urological Association, Serum PSA should decrease and remain at undetectable levels after radical prostatectomy. The AUA  defines biochemical recurrence as an initial PSA value 0.2 ng/mL or greater followed by a subsequent confirmatory PSA value 0.2 ng/mL or greater. Values obtained with different assay methods or kits cannot be used interchangeably. Results cannot be interpreted as absolute evidence of the presence or absence of malignant disease.   Urinalysis, Routine w reflex microscopic     Status: Abnormal   Collection Time: 10/18/20  9:25 AM  Result Value Ref Range   Specific Gravity, UA 1.020 1.005 - 1.030   pH, UA 8.5 (H) 5.0 - 7.5   Color, UA Yellow Yellow   Appearance Ur Clear Clear   Leukocytes,UA Negative Negative   Protein,UA Negative Negative/Trace   Glucose, UA Negative Negative   Ketones, UA Negative Negative   RBC, UA Negative Negative   Bilirubin, UA Negative Negative   Urobilinogen, Ur 1.0 0.2 - 1.0 mg/dL   Nitrite, UA Negative Negative   Microscopic Examination Comment     Comment: Microscopic follows if indicated.      Path report is pending for the stone analysis.   Studies/Results: Cystoscopy:   He was prepped with betadine, draped with sterile towels and the urethra was instilled with 2% lidocaine.  He was given cipro to cover the procedure.  Urethra: normal with the exception of a mild membranous stricture. Prostate: Bilobar hyperplasia with  no recurrent stones. Bladder: smooth without mucosal lesions.  Ureteral orifices: normal  Complications: none.     Assessment & Plan: Membranous stricture.  There is minimal recurrent stricturing.  Bladder and prostatic urethral dystrophic calcification have resolved with removal and medical therapy.[name]   He will continue the high fluid intake and polycitra.  History of bladder cancer.   Cystoscopy is clear today.  He will return in 3 months for cystoscopy and we will consider  1/2 dose BCG maintenance based on availability.   History of prostate cancer.  PSA was up to 1.0 in 8/21 but is back down to 0.9.  I will repeat in 3  months.   BOO.  He will continue tamsulosin.   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     Meds ordered this encounter  Medications  . ciprofloxacin (CIPRO) tablet 500 mg     Orders Placed This Encounter  Procedures  . Urinalysis, Routine w reflex microscopic  . PSA    Standing Status:   Future    Standing Expiration Date:   02/15/2021      Return in about 3 months (around 01/18/2021) for cystoscopy.   CC: Faylene Million, MD      Irine Seal 10/18/2020

## 2020-10-18 ENCOUNTER — Encounter: Payer: Self-pay | Admitting: Urology

## 2020-10-18 ENCOUNTER — Ambulatory Visit (INDEPENDENT_AMBULATORY_CARE_PROVIDER_SITE_OTHER): Payer: Medicare Other | Admitting: Urology

## 2020-10-18 ENCOUNTER — Other Ambulatory Visit: Payer: Self-pay

## 2020-10-18 VITALS — BP 155/71 | HR 83 | Temp 98.4°F | Ht 70.0 in | Wt 207.0 lb

## 2020-10-18 DIAGNOSIS — C678 Malignant neoplasm of overlapping sites of bladder: Secondary | ICD-10-CM

## 2020-10-18 DIAGNOSIS — Z8551 Personal history of malignant neoplasm of bladder: Secondary | ICD-10-CM | POA: Diagnosis not present

## 2020-10-18 DIAGNOSIS — R9721 Rising PSA following treatment for malignant neoplasm of prostate: Secondary | ICD-10-CM

## 2020-10-18 DIAGNOSIS — N99112 Postprocedural membranous urethral stricture: Secondary | ICD-10-CM

## 2020-10-18 DIAGNOSIS — Z8546 Personal history of malignant neoplasm of prostate: Secondary | ICD-10-CM | POA: Diagnosis not present

## 2020-10-18 LAB — URINALYSIS, ROUTINE W REFLEX MICROSCOPIC
Bilirubin, UA: NEGATIVE
Glucose, UA: NEGATIVE
Ketones, UA: NEGATIVE
Leukocytes,UA: NEGATIVE
Nitrite, UA: NEGATIVE
Protein,UA: NEGATIVE
RBC, UA: NEGATIVE
Specific Gravity, UA: 1.02 (ref 1.005–1.030)
Urobilinogen, Ur: 1 mg/dL (ref 0.2–1.0)
pH, UA: 8.5 — ABNORMAL HIGH (ref 5.0–7.5)

## 2020-10-18 MED ORDER — CIPROFLOXACIN HCL 500 MG PO TABS
500.0000 mg | ORAL_TABLET | Freq: Once | ORAL | Status: AC
  Administered 2020-10-18: 500 mg via ORAL

## 2020-10-18 NOTE — Progress Notes (Signed)
Urological Symptom Review  Patient is experiencing the following symptoms: Get up at night to urinate Weak stream   Review of Systems  Gastrointestinal (upper)  : Negative for upper GI symptoms  Gastrointestinal (lower) : Negative for lower GI symptoms  Constitutional : Negative for symptoms  Skin: Negative for skin symptoms  Eyes: Negative for eye symptoms  Ear/Nose/Throat : Negative for Ear/Nose/Throat symptoms  Hematologic/Lymphatic: Negative for Hematologic/Lymphatic symptoms  Cardiovascular : Negative for cardiovascular symptoms  Respiratory : Negative for respiratory symptoms  Endocrine: Negative for endocrine symptoms  Musculoskeletal: Negative for musculoskeletal symptoms  Neurological: Negative for neurological symptoms  Psychologic: Negative for psychiatric symptoms  

## 2020-11-18 ENCOUNTER — Ambulatory Visit (INDEPENDENT_AMBULATORY_CARE_PROVIDER_SITE_OTHER): Payer: Medicare Other | Admitting: Gastroenterology

## 2020-11-18 ENCOUNTER — Encounter: Payer: Self-pay | Admitting: Gastroenterology

## 2020-11-18 VITALS — BP 134/70 | HR 76 | Ht 69.5 in | Wt 212.4 lb

## 2020-11-18 DIAGNOSIS — K222 Esophageal obstruction: Secondary | ICD-10-CM

## 2020-11-18 DIAGNOSIS — K2100 Gastro-esophageal reflux disease with esophagitis, without bleeding: Secondary | ICD-10-CM | POA: Diagnosis not present

## 2020-11-18 DIAGNOSIS — Z860101 Personal history of adenomatous and serrated colon polyps: Secondary | ICD-10-CM

## 2020-11-18 DIAGNOSIS — Z86010 Personal history of colonic polyps: Secondary | ICD-10-CM | POA: Diagnosis not present

## 2020-11-18 NOTE — Progress Notes (Signed)
    History of Present Illness: This is a 64 year old male returning for follow-up of an esophageal stricture causing dysphagia.  Following EGD with dilation in late October he relates complete resolution of his dysphagia. He has no difficulties with any foods.  He is very pleased with the results.  No gastrointestinal complaints today.  Current Medications, Allergies, Past Medical History, Past Surgical History, Family History and Social History were reviewed in [employer] record.   Physical Exam: General: Well developed, well nourished, no acute distress Head: Normocephalic and atraumatic Eyes:  sclerae anicteric, EOMI Ears: Normal auditory acuity Mouth: Not examined, mask on during Covid-19 pandemic Lungs: Clear throughout to auscultation Heart: Regular rate and rhythm; no murmurs, rubs or bruits Abdomen: Soft, non tender and non distended. No masses, hepatosplenomegaly or hernias noted. Normal Bowel sounds Rectal: Not done Musculoskeletal: Symmetrical with no gross deformities  Pulses:  Normal pulses noted Extremities: No clubbing, cyanosis, edema or deformities noted Neurological: Alert oriented x 4, grossly nonfocal Psychological:  Alert and cooperative. Normal mood and affect   Assessment and Recommendations:  1.  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 Mild reactive gastropathy  Dysphagia has completely resolved and he denies reflux symptoms.  Continue to follow antireflux measures long term and continue pantoprazole 40 mg daily long term.  If he has frequent breakthrough reflux symptoms or a return of solid food dysphagia he is advised to call. REV in 1 year.   2.  Personal history of one small adenomatous colon polyp on his recent colonoscopy.  A 7-year interval surveillance colonoscopy is recommended in October 2028.

## 2020-11-18 NOTE — Patient Instructions (Addendum)
If you are age 66 or older, your body mass index should be between 23-30. Your Body mass index is 30.91 kg/m. If this is out of the aforementioned range listed, please consider follow up with your Primary Care Provider.  If you are age 77 or younger, your body mass index should be between 19-25. Your Body mass index is 30.91 kg/m. If this is out of the aformentioned range listed, please consider follow up with your Primary Care Provider.    Please continue taking your Protonix and follow up in a year.   It was great seeing you today!  Thank you for entrusting me with your care and choosing [facility].  Dr. Fuller Plan

## 2020-11-27 ENCOUNTER — Other Ambulatory Visit: Payer: Self-pay | Admitting: Urology

## 2020-11-27 DIAGNOSIS — N401 Enlarged prostate with lower urinary tract symptoms: Secondary | ICD-10-CM

## 2021-01-09 ENCOUNTER — Other Ambulatory Visit: Payer: Medicare Other

## 2021-01-09 ENCOUNTER — Other Ambulatory Visit: Payer: Self-pay

## 2021-01-09 DIAGNOSIS — Z8546 Personal history of malignant neoplasm of prostate: Secondary | ICD-10-CM

## 2021-01-10 LAB — PSA: Prostate Specific Ag, Serum: 1 ng/mL (ref 0.0–4.0)

## 2021-01-14 NOTE — Progress Notes (Signed)
Subjective:  1. History of bladder cancer   2. History of prostate cancer   3. Bladder stones   4. 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   He has had BCG induction and a year of maintenance.  He is voiding well and has been able to come off of tamsulosin.  He has an adequate stream.  He has a left epididymal granuloma that is unchanged per his report.  His UA is clear today and he has had no hematuria. He remains on polycitra K to prevent dystrophic calcification.  His PSA was 1.0 prior to this visit which is stable over the last 6 months.   10/18/20: Jack Mcdonald returns today in f/u.  On 01/11/20 He had cystoscopy with removal of dystrophic calcifications and dilation of a membranous stricture.  He has a good stream.  He has reduced urgency but he does have frequency with 100oz water intake daily.   He has no hematuria.   He got induction BCG with the last on 03/27/20 and then had 3 weeks of maintenance with the last on 07/19/20.  He had bleeding after the treatment on a full dose but did better with 1/2 dose with the last of the 3.   His UA is clear today.  He has an adequate but reduced stream. .   He had some mild irritation but no flu like symptoms or fever.   He has had no hematuria or dysuria.  He is very pleased with his symptoms.  He has some reduction of the stream but did have a stricture.  He will have some intermittency. HIs UA is clear today.   He remains on poly citra and tamsulosin.  He has a history of prostate cancer treated with a seed implant and his PSA on 10/10/20 was 0.9.  It was 1.0 on 06/28/20.  He remains on potassium citrate to prevent recurrent calcium deposition.  He has a new left epididymal lesion.    .  IPSS    Row Name 01/16/21 1100         International Prostate Symptom Score   How often have you had the  sensation of not emptying your bladder? Not at All     How often have you had to urinate less than every two hours? Less than half the time     How often have you found you stopped and started again several times when you urinated? Not at All     How often have you found it difficult to postpone urination? Not at All     How often have you had a weak urinary stream? About half the time     How often have you had to strain to start urination? Not at All     How many times did you typically get up at night to urinate? 2 Times     Total IPSS Score 7           Quality of Life due to urinary symptoms   If you were to spend the rest of your life with your urinary condition just the way it is now how would you feel about that? Mostly Satisfied            ROS:  ROS:  A complete review of systems was performed.  All systems are negative except  for pertinent findings as noted.   ROS  No Known Allergies  Outpatient Encounter Medications as of 01/16/2021  Medication Sig  . citric acid-potassium citrate (POLYCITRA) 1100-334 MG/5ML solution Take 5 mLs (10 mEq total) by mouth 3 (three) times daily.  . hydrochlorothiazide (MICROZIDE) 12.5 MG capsule Take 12.5 mg by mouth daily.   . metoprolol tartrate (LOPRESSOR) 25 MG tablet Take 12.5 mg by mouth 2 (two) times daily.   . pantoprazole (PROTONIX) 40 MG tablet Take 1 tablet (40 mg total) by mouth daily.  [name] telmisartan (MICARDIS) 40 MG tablet Take 40 mg by mouth daily.   . [DISCONTINUED] tamsulosin (FLOMAX) 0.4 MG CAPS capsule Take 1 capsule by mouth once daily   Facility-Administered Encounter Medications as of 01/16/2021  Medication  . gemcitabine (GEMZAR) chemo syringe for bladder instillation 2,000 mg    Past Medical History:  Diagnosis Date  . Bladder cancer ([place])    imm therapy   . BPH with urinary obstruction    urologist--- dr Jeffie Pollock  . Dysphagia    01-05-2020  per pt states chews well and small bites  . History of external beam  radiation therapy    prostate cancer--- completed 2014  . History of kidney stones   . History of prostate cancer urologist--- dr Jeffie Pollock   dx 02-21-2013   Stage T2a, Gleason 3+4;   completed radiation  . Hypertension    followed by cardiology-- Dr Carrolyn Leigh ([place])   per cardiololgy note pt had ETT 03/ 2018 negative for ishcemia  . Moderate aortic regurgitation    per 06-24-19 echo dr Lennette Bihari [facility]le [facility]  (scanned in media in epic)  . Nocturia   . Palpitations    followed by cardiologist--- dr Raliegh Ip. Gibson Ramp;  per dr Gibson Ramp note , holter monior 01/ 2018 infrequent supraventricular and ventricular ectopic activity  . Prostate calculus   . Prostate cancer ([place]) 01/04/13   Adenocarcinoma,gleason=3+4=7,& 3+3=6,PSA=3.94,Volume=63cc  . S/P TURP   . Tubular adenoma of colon 2017  . Urethral stricture   . Wears glasses     Past Surgical History:  Procedure Laterality Date  . COLONOSCOPY  age 25   few polyps  . CYSTOSCOPY WITH URETHRAL DILATATION N/A 01/11/2020   Procedure: CYSTOSCOPY WITH URETHRAL DILATATION AND LITHOLAPAXY;  Surgeon: Irine Seal, MD;  Location: [facility];  Service: Urology;  Laterality: N/A;  . PROSTATE BIOPSY  01/04/13   Adenocarncinoma  . TRANSURETHRAL RESECTION OF BLADDER TUMOR WITH MITOMYCIN-C Bilateral 11/09/2019   Procedure: TRANSURETHRAL RESECTION OF BLADDER TUMOR;  Surgeon: Irine Seal, MD;  Location: [facility];  Service: Urology;  Laterality: Bilateral;  . TRANSURETHRAL RESECTION OF PROSTATE N/A 01/11/2020   Procedure: TRANSURETHRAL RESECTION REMOVAL OF STONES;  Surgeon: Irine Seal, MD;  Location: [facility];  Service: Urology;  Laterality: N/A;    Social History   Socioeconomic History  . Marital status: Married    Spouse name: Not on file  . Number of children: 1  . Years of education: Not on file  . Highest education level: Not on file  Occupational History  .  Occupation: Painter  Tobacco Use  . Smoking status: Never Smoker  . Smokeless tobacco: Never Used  Vaping Use  . Vaping Use: Never used  Substance and Sexual Activity  . Alcohol use: Yes    Comment: 2 per day mixed drinks  . Drug use: No  . Sexual activity: Yes  Other Topics  Concern  . Not on file  Social History Narrative  . Not on file   Social Determinants of Health   Financial Resource Strain: Not on file  Food Insecurity: Not on file  Transportation Needs: Not on file  Physical Activity: Not on file  Stress: Not on file  Social Connections: Not on file  Intimate Partner Violence: Not on file    Family History  Problem Relation Age of Onset  . Colon cancer Neg Hx   . Colon polyps Neg Hx   . Rectal cancer Neg Hx   . Stomach cancer Neg Hx   . Pancreatic cancer Neg Hx   . Esophageal cancer Neg Hx        Objective: Vitals:   01/16/21 1049  BP: 131/78  Pulse: 71  Temp: 98.7 F (37.1 C)     Physical Exam  Lab Results:  No results found for this or any previous visit (from the past 24 hour(s)).  BMET No results for input(s): NA, K, CL, CO2, GLUCOSE, BUN, CREATININE, CALCIUM in the last 72 hours. PSA Recent Results (from the past 2160 hour(s))  PSA     Status: None   Collection Time: 01/09/21  8:48 AM  Result Value Ref Range   Prostate Specific Ag, Serum 1.0 0.0 - 4.0 ng/mL    Comment: Roche ECLIA methodology. According to the American Urological Association, Serum PSA should decrease and remain at undetectable levels after radical prostatectomy. The AUA defines biochemical recurrence as an initial PSA value 0.2 ng/mL or greater followed by a subsequent confirmatory PSA value 0.2 ng/mL or greater. Values obtained with different assay methods or kits cannot be used interchangeably. Results cannot be interpreted as absolute evidence of the presence or absence of malignant disease.    Lab Results  Component Value Date   PSA1 1.0 01/09/2021   PSA1  0.9 10/10/2020   PSA1 1.0 06/28/2020      Studies/Results: Cystoscopy:   He was prepped with betadine, draped with sterile towels.  He was given cipro to cover the procedure.  The well lubricated flexible scope was passed.  Urethra: normal with the exception of a mild membranous stricture. Prostate: Bilobar hyperplasia with  no recurrent stones. Bladder: smooth without mucosal lesions.  Ureteral orifices: normal  Complications: none.     Assessment & Plan: Membranous stricture.  There is minimal recurrent stricturing.  Bladder and prostatic urethral dystrophic calcification have resolved with removal and medical therapy.[name]   He will continue the high fluid intake and polycitra but consider stopping the med at 6 months f/u for cystoscopy.   History of bladder cancer.   Cystoscopy is clear today.  He will return in 6 months for cystoscopy and with his intermediate risk low grade disease I don't think he needs further maintenance.    History of prostate cancer.  PSA is stable over the last 6 months at 1.0.   Repeat in 6 months.    BOO.  He is off of the tamsulosin.   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     No orders of the defined types were placed in this encounter.    Orders Placed This Encounter  Procedures  . Urinalysis, Routine w reflex microscopic  . PSA    Standing Status:   Future    Standing Expiration Date:   01/16/2022      Return in about 6 months (around 07/16/2021) for with cytoscopy.  CC: Faylene Million, MD      Irine Seal 01/16/2021

## 2021-01-16 ENCOUNTER — Other Ambulatory Visit: Payer: Self-pay

## 2021-01-16 ENCOUNTER — Ambulatory Visit (INDEPENDENT_AMBULATORY_CARE_PROVIDER_SITE_OTHER): Payer: Medicare Other | Admitting: Urology

## 2021-01-16 ENCOUNTER — Encounter: Payer: Self-pay | Admitting: Urology

## 2021-01-16 VITALS — BP 131/78 | HR 71 | Temp 98.7°F | Ht 70.0 in | Wt 200.0 lb

## 2021-01-16 DIAGNOSIS — Z8551 Personal history of malignant neoplasm of bladder: Secondary | ICD-10-CM | POA: Diagnosis not present

## 2021-01-16 DIAGNOSIS — N138 Other obstructive and reflux uropathy: Secondary | ICD-10-CM

## 2021-01-16 DIAGNOSIS — Z8546 Personal history of malignant neoplasm of prostate: Secondary | ICD-10-CM | POA: Diagnosis not present

## 2021-01-16 DIAGNOSIS — N401 Enlarged prostate with lower urinary tract symptoms: Secondary | ICD-10-CM | POA: Diagnosis not present

## 2021-01-16 DIAGNOSIS — N210 Calculus in bladder: Secondary | ICD-10-CM | POA: Diagnosis not present

## 2021-01-16 LAB — URINALYSIS, ROUTINE W REFLEX MICROSCOPIC
Bilirubin, UA: NEGATIVE
Glucose, UA: NEGATIVE
Ketones, UA: NEGATIVE
Leukocytes,UA: NEGATIVE
Nitrite, UA: NEGATIVE
RBC, UA: NEGATIVE
Specific Gravity, UA: 1.02 (ref 1.005–1.030)
Urobilinogen, Ur: 2 mg/dL — ABNORMAL HIGH (ref 0.2–1.0)
pH, UA: 7.5 (ref 5.0–7.5)

## 2021-01-16 MED ORDER — CIPROFLOXACIN HCL 500 MG PO TABS
500.0000 mg | ORAL_TABLET | Freq: Once | ORAL | Status: AC
  Administered 2021-01-16: 500 mg via ORAL

## 2021-01-16 NOTE — Progress Notes (Signed)

## 2021-01-17 LAB — CYTOLOGY, URINE

## 2021-01-20 NOTE — Progress Notes (Signed)
Results sent via mail.  

## 2021-05-27 ENCOUNTER — Other Ambulatory Visit: Payer: Self-pay

## 2021-05-27 MED ORDER — PANTOPRAZOLE SODIUM 40 MG PO TBEC: 40.0000 mg | DELAYED_RELEASE_TABLET | Freq: Every day | ORAL | 1 refills | Status: DC

## 2021-07-10 ENCOUNTER — Other Ambulatory Visit: Payer: Medicare Other

## 2021-07-10 ENCOUNTER — Other Ambulatory Visit: Payer: Self-pay

## 2021-07-10 DIAGNOSIS — Z8546 Personal history of malignant neoplasm of prostate: Secondary | ICD-10-CM

## 2021-07-11 LAB — PSA: Prostate Specific Ag, Serum: 0.9 ng/mL (ref 0.0–4.0)

## 2021-07-17 ENCOUNTER — Encounter: Payer: Self-pay | Admitting: Urology

## 2021-07-17 ENCOUNTER — Ambulatory Visit (INDEPENDENT_AMBULATORY_CARE_PROVIDER_SITE_OTHER): Payer: Medicare Other | Admitting: Urology

## 2021-07-17 ENCOUNTER — Other Ambulatory Visit: Payer: Self-pay

## 2021-07-17 VITALS — BP 151/92 | HR 74

## 2021-07-17 DIAGNOSIS — N99112 Postprocedural membranous urethral stricture: Secondary | ICD-10-CM

## 2021-07-17 DIAGNOSIS — Z8551 Personal history of malignant neoplasm of bladder: Secondary | ICD-10-CM

## 2021-07-17 DIAGNOSIS — Z8546 Personal history of malignant neoplasm of prostate: Secondary | ICD-10-CM

## 2021-07-17 LAB — URINALYSIS, ROUTINE W REFLEX MICROSCOPIC
Bilirubin, UA: NEGATIVE
Glucose, UA: NEGATIVE
Ketones, UA: NEGATIVE
Leukocytes,UA: NEGATIVE
Nitrite, UA: NEGATIVE
RBC, UA: NEGATIVE
Specific Gravity, UA: 1.02 (ref 1.005–1.030)
Urobilinogen, Ur: 2 mg/dL — ABNORMAL HIGH (ref 0.2–1.0)
pH, UA: 7.5 (ref 5.0–7.5)

## 2021-07-17 MED ORDER — CIPROFLOXACIN HCL 500 MG PO TABS
500.0000 mg | ORAL_TABLET | Freq: Once | ORAL | Status: AC
  Administered 2021-07-17: 500 mg via ORAL

## 2021-07-17 MED ORDER — CIPROFLOXACIN HCL 500 MG PO TABS: 500.0000 mg | ORAL_TABLET | Freq: Once | ORAL | Status: DC

## 2021-07-17 NOTE — Progress Notes (Signed)
Urological Symptom Review  Patient is experiencing the following symptoms: Get up at night to urinate Weak stream   Review of Systems  Gastrointestinal (upper)  : Negative for upper GI symptoms  Gastrointestinal (lower) : Negative for lower GI symptoms  Constitutional : Negative for symptoms  Skin: Negative for skin symptoms  Eyes: Negative for eye symptoms  Ear/Nose/Throat : Negative for Ear/Nose/Throat symptoms  Hematologic/Lymphatic: Negative for Hematologic/Lymphatic symptoms  Cardiovascular : Negative for cardiovascular symptoms  Respiratory : Negative for respiratory symptoms  Endocrine: Negative for endocrine symptoms  Musculoskeletal: Negative for musculoskeletal symptoms  Neurological: Negative for neurological symptoms  Psychologic: Negative for psychiatric symptoms  

## 2021-07-17 NOTE — Progress Notes (Signed)
Subjective:  1. History of bladder cancer   2. History of prostate cancer   3. Postprocedural membranous urethral stricture, male       07/17/21: Jack Mcdonald returns today for the history noted below for surveillance cystoscopy.  He had a PSA prior to this visit that was slightly lower at 0.9.   He reports a slower stream and hesitancy and he feels that the stricture maybe recurring.   His UA is clear today and he has had no hematuria.   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   He has had BCG induction and a year of maintenance.  He is voiding well and has been able to come off of tamsulosin.  He has an adequate stream.  He has a left epididymal granuloma that is unchanged per his report.  His UA is clear today and he has had no hematuria. He remains on polycitra K to prevent dystrophic calcification.  His PSA was 1.0 prior to this visit which is stable over the last 6 months.   10/18/20: Jack Mcdonald returns today in f/u.  On 01/11/20 He had cystoscopy with removal of dystrophic calcifications and dilation of a membranous stricture.  He has a good stream.  He has reduced urgency but he does have frequency with 100oz water intake daily.   He has no hematuria.   He got induction BCG with the last on 03/27/20 and then had 3 weeks of maintenance with the last on 07/19/20.  He had bleeding after the treatment on a full dose but did better with 1/2 dose with the last of the 3.   His UA is clear today.  He has an adequate but reduced stream. .   He had some mild irritation but no flu like symptoms or fever.   He has had no hematuria or dysuria.  He is very pleased with his symptoms.  He has some reduction of the stream but did have a stricture.  He will have some intermittency. HIs UA is clear today.   He remains on poly citra and tamsulosin.  He has a history of prostate cancer treated with a seed implant and his  PSA on 10/10/20 was 0.9.  It was 1.0 on 06/28/20.  He remains on potassium citrate to prevent recurrent calcium deposition.  He has a new left epididymal lesion.    .  IPSS     Row Name 07/17/21 0900         International Prostate Symptom Score   How often have you had the sensation of not emptying your bladder? About half the time     How often have you had to urinate less than every two hours? More than half the time     How often have you found you stopped and started again several times when you urinated? Less than half the time     How often have you found it difficult to postpone urination? Not at All     How often have you had a weak urinary stream? About half the time     How often have you had to strain to start urination? Not at All     How many times did you typically get up at night to urinate? 2 Times     Total IPSS Score 14           Quality of Life due to urinary  symptoms   If you were to spend the rest of your life with your urinary condition just the way it is now how would you feel about that? Mostly Satisfied               ROS:  ROS:  A complete review of systems was performed.  All systems are negative except for pertinent findings as noted.   ROS  Allergies  Allergen Reactions   Ciprofloxacin     Outpatient Encounter Medications as of 07/17/2021  Medication Sig   citric acid-potassium citrate (POLYCITRA) 1100-334 MG/5ML solution Take 5 mLs (10 mEq total) by mouth 3 (three) times daily.   hydrochlorothiazide (MICROZIDE) 12.5 MG capsule Take 12.5 mg by mouth daily.    metoprolol tartrate (LOPRESSOR) 25 MG tablet Take 12.5 mg by mouth 2 (two) times daily.    pantoprazole (PROTONIX) 40 MG tablet Take 1 tablet (40 mg total) by mouth daily.   telmisartan (MICARDIS) 40 MG tablet Take 40 mg by mouth daily.    Facility-Administered Encounter Medications as of 07/17/2021  Medication   [COMPLETED] ciprofloxacin (CIPRO) tablet 500 mg   gemcitabine (GEMZAR)  chemo syringe for bladder instillation 2,000 mg   [DISCONTINUED] ciprofloxacin (CIPRO) tablet 500 mg    Past Medical History:  Diagnosis Date   Bladder cancer ([place])    imm therapy    BPH with urinary obstruction    urologist--- dr Jeffie Pollock   Dysphagia    01-05-2020  per pt states chews well and small bites   History of external beam radiation therapy    prostate cancer--- completed 2014   History of kidney stones    History of prostate cancer urologist--- dr Jeffie Pollock   dx 02-21-2013   Stage T2a, Gleason 3+4;   completed radiation   Hypertension    followed by cardiology-- Dr Carrolyn Leigh ([place])   per cardiololgy note pt had ETT 03/ 2018 negative for ishcemia   Moderate aortic regurgitation    per 06-24-19 echo dr Lennette Bihari lingle [facility]  (scanned in media in epic)   Nocturia    Palpitations    followed by cardiologist--- dr Raliegh Ip. Gibson Ramp;  per dr Gibson Ramp note , holter monior 01/ 2018 infrequent supraventricular and ventricular ectopic activity   Prostate calculus    Prostate cancer ([place]) 01/04/13   Adenocarcinoma,gleason=3+4=7,& 3+3=6,PSA=3.94,Volume=63cc   S/P TURP    Tubular adenoma of colon 2017   Urethral stricture    Wears glasses     Past Surgical History:  Procedure Laterality Date   COLONOSCOPY  age 29   few polyps   CYSTOSCOPY WITH URETHRAL DILATATION N/A 01/11/2020   Procedure: CYSTOSCOPY WITH URETHRAL DILATATION AND LITHOLAPAXY;  Surgeon: Irine Seal, MD;  Location: [place];  Service: Urology;  Laterality: N/A;   PROSTATE BIOPSY  01/04/13   Adenocarncinoma   TRANSURETHRAL RESECTION OF BLADDER TUMOR WITH MITOMYCIN-C Bilateral 11/09/2019   Procedure: TRANSURETHRAL RESECTION OF BLADDER TUMOR;  Surgeon: Irine Seal, MD;  Location: [facility];  Service: Urology;  Laterality: Bilateral;   TRANSURETHRAL RESECTION OF PROSTATE N/A 01/11/2020   Procedure: TRANSURETHRAL RESECTION REMOVAL OF STONES;  Surgeon:  Irine Seal, MD;  Location: [facility];  Service: Urology;  Laterality: N/A;    Social History   Socioeconomic History   Marital status: Married    Spouse name: Not on file   Number of children: 1   Years of education: Not on file  Highest education level: Not on file  Occupational History   Occupation: Painter  Tobacco Use   Smoking status: Never   Smokeless tobacco: Never  Vaping Use   Vaping Use: Never used  Substance and Sexual Activity   Alcohol use: Yes    Comment: 2 per day mixed drinks   Drug use: No   Sexual activity: Yes  Other Topics Concern   Not on file  Social History Narrative   Not on file   Social Determinants of Health   Financial Resource Strain: Not on file  Food Insecurity: Not on file  Transportation Needs: Not on file  Physical Activity: Not on file  Stress: Not on file  Social Connections: Not on file  Intimate Partner Violence: Not on file    Family History  Problem Relation Age of Onset   Colon cancer Neg Hx    Colon polyps Neg Hx    Rectal cancer Neg Hx    Stomach cancer Neg Hx    Pancreatic cancer Neg Hx    Esophageal cancer Neg Hx        Objective: Vitals:   07/17/21 0940  BP: (!) 151/92  Pulse: 74     Physical Exam  Lab Results:  No results found for this or any previous visit (from the past 24 hour(s)).  BMET No results for input(s): NA, K, CL, CO2, GLUCOSE, BUN, CREATININE, CALCIUM in the last 72 hours. PSA Recent Results (from the past 2160 hour(s))  PSA     Status: None   Collection Time: 07/10/21 11:24 AM  Result Value Ref Range   Prostate Specific Ag, Serum 0.9 0.0 - 4.0 ng/mL    Comment: Roche ECLIA methodology. According to the American Urological Association, Serum PSA should decrease and remain at undetectable levels after radical prostatectomy. The AUA defines biochemical recurrence as an initial PSA value 0.2 ng/mL or greater followed by a subsequent confirmatory PSA value 0.2 ng/mL  or greater. Values obtained with different assay methods or kits cannot be used interchangeably. Results cannot be interpreted as absolute evidence of the presence or absence of malignant disease.    Lab Results  Component Value Date   PSA1 0.9 07/10/2021   PSA1 1.0 01/09/2021   PSA1 0.9 10/10/2020    UA is clear.   Report entry not complete.   Studies/Results: Cystoscopy:   He was prepped with betadine, draped with sterile towels.  He was given cipro to cover the procedure.  The well lubricated flexible scope was passed.  Urethra: normal with the exception of a mild membranous stricture that was tighter than at the previous cystoscopy.  I was able to pass the scope and disrupt the stricture to some degree. . Prostate: Bilobar hyperplasia with  no recurrent stones. Bladder: smooth without mucosal lesions with the exception of a 3m ovoid lesion on the posterior wall that appears most consistent with a vascular anomaly.  Ureteral orifices: normal  Complications: none.     Assessment & Plan: Membranous stricture.  There is mild recurrent stricturing that I disrupted with the scope.  Bladder and prostatic urethral dystrophic calcification have resolved with removal and medical therapy..[name]  He will continue the high fluid intake and polycitra.   History of bladder cancer.   Cystoscopy is clear today except for a tiny red spot on the posterior wall.  He will return in 3 months instead of 6 months for cystoscopy and a cytology was sent today.  With his intermediate risk low  grade disease I don't think he needs further maintenance BCG.    History of prostate cancer.  PSA is stable over the last 6 months at 0.9.   Repeat in 6 months.    BOO.  He is off of the tamsulosin.      Meds ordered this encounter  Medications   DISCONTD: ciprofloxacin (CIPRO) tablet 500 mg   ciprofloxacin (CIPRO) tablet 500 mg     Orders Placed This Encounter  Procedures   Urinalysis, Routine w reflex  microscopic      Return in about 3 months (around 10/17/2021) for cystoscopy.   CC: Dhivianathan, Candida Peeling, MD      Irine Seal 07/17/2021 Patient ID: Anastasia Fiedler, male   DOB: 1955-12-30, 65 y.o.   MRN: [id number]

## 2021-07-19 LAB — CYTOLOGY, URINE

## 2021-07-21 ENCOUNTER — Telehealth: Payer: Self-pay

## 2021-07-21 NOTE — Telephone Encounter (Signed)
See prior note

## 2021-07-21 NOTE — Telephone Encounter (Signed)
-----   Message from Irine Seal, MD sent at 07/21/2021  9:11 AM EDT ----- Negative cytology ----- Message ----- From: Iris Pert, LPN Sent: [id number]   9:05 AM EDT To: Irine Seal, MD  Please review

## 2021-07-21 NOTE — Progress Notes (Signed)
Sent via mail 

## 2021-07-21 NOTE — Telephone Encounter (Signed)
Patient returned call and made aware.

## 2021-07-21 NOTE — Telephone Encounter (Signed)
Patient called with no answer. Message left to return call. Letter sent.

## 2021-08-04 ENCOUNTER — Other Ambulatory Visit: Payer: Self-pay | Admitting: Urology

## 2021-08-04 DIAGNOSIS — N210 Calculus in bladder: Secondary | ICD-10-CM

## 2021-09-22 ENCOUNTER — Other Ambulatory Visit: Payer: Self-pay | Admitting: Gastroenterology

## 2021-10-01 ENCOUNTER — Ambulatory Visit: Payer: Medicare Other | Admitting: Gastroenterology

## 2021-10-16 ENCOUNTER — Ambulatory Visit (INDEPENDENT_AMBULATORY_CARE_PROVIDER_SITE_OTHER): Payer: Medicare Other | Admitting: Urology

## 2021-10-16 ENCOUNTER — Encounter: Payer: Self-pay | Admitting: Urology

## 2021-10-16 ENCOUNTER — Other Ambulatory Visit: Payer: Self-pay

## 2021-10-16 VITALS — BP 146/77 | HR 73 | Temp 98.1°F

## 2021-10-16 DIAGNOSIS — Z8546 Personal history of malignant neoplasm of prostate: Secondary | ICD-10-CM

## 2021-10-16 DIAGNOSIS — Z8551 Personal history of malignant neoplasm of bladder: Secondary | ICD-10-CM | POA: Diagnosis not present

## 2021-10-16 DIAGNOSIS — N210 Calculus in bladder: Secondary | ICD-10-CM

## 2021-10-16 DIAGNOSIS — N99112 Postprocedural membranous urethral stricture: Secondary | ICD-10-CM

## 2021-10-16 MED ORDER — CIPROFLOXACIN HCL 500 MG PO TABS
500.0000 mg | ORAL_TABLET | Freq: Once | ORAL | Status: AC
  Administered 2021-10-16: 10:00:00 500 mg via ORAL

## 2021-10-16 NOTE — Addendum Note (Signed)
Addended by: Irine Seal on: 10/16/2021 10:01 AM   Modules accepted: Orders, Level of Service

## 2021-10-16 NOTE — Progress Notes (Addendum)
Subjective:  1. History of bladder cancer     10/16/21: Jack Mcdonald returns today in f/u for surveillance cystoscopy.  His cytology from 8/18 was negative. He had  a small red spot on the bladder wall so he is brought back for a 3 months cysto instead of 6 months.  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   07/17/21: Jack Mcdonald returns today for the history noted below for surveillance cystoscopy.  He had a PSA prior to this visit that was slightly lower at 0.9.   He reports a slower stream and hesitancy and he feels that the stricture maybe recurring.   His UA is clear today and he has had no hematuria.   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   He has had BCG induction and a year of maintenance.  He is voiding well and has been able to come off of tamsulosin.  He has an adequate stream.  He has a left epididymal granuloma that is unchanged per his report.  His UA is clear today and he has had no hematuria. He remains on polycitra K to prevent dystrophic calcification.  His PSA was 1.0 prior to this visit which is stable over the last 6 months.   10/18/20: Jack Mcdonald returns today in f/u.  On 01/11/20 He had cystoscopy with removal of dystrophic calcifications and dilation of a membranous stricture.  He has a good stream.  He has reduced urgency but he does have frequency with 100oz water intake daily.   He has no hematuria.   He got induction BCG with the last on 03/27/20 and then had 3 weeks of maintenance with the last on 07/19/20.  He had bleeding after the treatment on a full dose but did better with 1/2 dose with the last of the 3.   His UA is clear today.  He has an adequate but reduced stream. .   He had some mild irritation but no flu like symptoms or fever.   He has had no hematuria or dysuria.  He is very pleased with his symptoms.  He has some reduction of the stream but did have  a stricture.  He will have some intermittency. HIs UA is clear today.   He remains on poly citra and tamsulosin.  He has a history of prostate cancer treated with a seed implant and his PSA on 10/10/20 was 0.9.  It was 1.0 on 06/28/20.  He remains on potassium citrate to prevent recurrent calcium deposition.  He has a new left epididymal lesion.    .     ROS:  ROS:  A complete review of systems was performed.  All systems are negative except for pertinent findings as noted.   ROS  Allergies  Allergen Reactions   Ciprofloxacin     Outpatient Encounter Medications as of 10/16/2021  Medication Sig   citric acid-potassium citrate (POLYCITRA) 1100-334 MG/5ML solution TAKE 1 TEASPOONFUL (10 MEQ) BY MOUTH THREE TIMES DAILY   hydrochlorothiazide (MICROZIDE) 12.5 MG capsule Take 12.5 mg by mouth daily.    metoprolol tartrate (LOPRESSOR) 25 MG tablet Take 12.5 mg by mouth 2 (two) times daily.    pantoprazole (PROTONIX) 40 MG tablet TAKE 1 TABLET EVERY DAY   telmisartan (MICARDIS) 40 MG tablet Take 40 mg by mouth daily.    Facility-Administered Encounter Medications as of 10/16/2021  Medication   gemcitabine (GEMZAR) chemo  syringe for bladder instillation 2,000 mg    Past Medical History:  Diagnosis Date   Bladder cancer ([place])    imm therapy    BPH with urinary obstruction    urologist--- dr Jeffie Pollock   Dysphagia    01-05-2020  per pt states chews well and small bites   History of external beam radiation therapy    prostate cancer--- completed 2014   History of kidney stones    History of prostate cancer urologist--- dr Jeffie Pollock   dx 02-21-2013   Stage T2a, Gleason 3+4;   completed radiation   Hypertension    followed by cardiology-- Dr Carrolyn Leigh ([place])   per cardiololgy note pt had ETT 03/ 2018 negative for ishcemia   Moderate aortic regurgitation    per 06-24-19 echo dr Lennette Bihari [facility]  (scanned in media in epic)   Nocturia     Palpitations    followed by cardiologist--- dr Raliegh Ip. Gibson Ramp;  per dr Gibson Ramp note , holter monior 01/ 2018 infrequent supraventricular and ventricular ectopic activity   Prostate calculus    Prostate cancer ([place]) 01/04/13   Adenocarcinoma,gleason=3+4=7,& 3+3=6,PSA=3.94,Volume=63cc   S/P TURP    Tubular adenoma of colon 2017   Urethral stricture    Wears glasses     Past Surgical History:  Procedure Laterality Date   COLONOSCOPY  age 1   few polyps   CYSTOSCOPY WITH URETHRAL DILATATION N/A 01/11/2020   Procedure: CYSTOSCOPY WITH URETHRAL DILATATION AND LITHOLAPAXY;  Surgeon: Irine Seal, MD;  Location: [facility];  Service: Urology;  Laterality: N/A;   PROSTATE BIOPSY  01/04/13   Adenocarncinoma   TRANSURETHRAL RESECTION OF BLADDER TUMOR WITH MITOMYCIN-C Bilateral 11/09/2019   Procedure: TRANSURETHRAL RESECTION OF BLADDER TUMOR;  Surgeon: Irine Seal, MD;  Location: [facility];  Service: Urology;  Laterality: Bilateral;   TRANSURETHRAL RESECTION OF PROSTATE N/A 01/11/2020   Procedure: TRANSURETHRAL RESECTION REMOVAL OF STONES;  Surgeon: Irine Seal, MD;  Location: [facility];  Service: Urology;  Laterality: N/A;    Social History   Socioeconomic History   Marital status: Married    Spouse name: Not on file   Number of children: 1   Years of education: Not on file   Highest education level: Not on file  Occupational History   Occupation: Curator  Tobacco Use   Smoking status: Never   Smokeless tobacco: Never  Vaping Use   Vaping Use: Never used  Substance and Sexual Activity   Alcohol use: Yes    Comment: 2 per day mixed drinks   Drug use: No   Sexual activity: Yes  Other Topics Concern   Not on file  Social History Narrative   Not on file   Social Determinants of Health   Financial Resource Strain: Not on file  Food Insecurity: Not on file  Transportation Needs: Not on file  Physical Activity: Not on file  Stress: Not on  file  Social Connections: Not on file  Intimate Partner Violence: Not on file    Family History  Problem Relation Age of Onset   Colon cancer Neg Hx    Colon polyps Neg Hx    Rectal cancer Neg Hx    Stomach cancer Neg Hx    Pancreatic cancer Neg Hx    Esophageal cancer Neg Hx        Objective: Vitals:   10/16/21 0938  BP: (!) 146/77  Pulse: 73  Temp: 98.1 F (36.7 C)     Physical Exam  Lab Results:  No results found for this or any previous visit (from the past 24 hour(s)).  BMET No results for input(s): NA, K, CL, CO2, GLUCOSE, BUN, CREATININE, CALCIUM in the last 72 hours. PSA No results found for this or any previous visit (from the past 2160 hour(s)).  Lab Results  Component Value Date   PSA1 0.9 07/10/2021   PSA1 1.0 01/09/2021   PSA1 0.9 10/10/2020    UA is clear.   Report entry not complete.   Studies/Results: Cystoscopy:   He was prepped with betadine, draped with sterile towels.  He was given cipro to cover the procedure.  The well lubricated flexible scope was passed.  Urethra: normal with the exception of a mild membranous stricture.  I was able to pass the scope and disrupt the stricture to some degree. . Prostate: Bilobar hyperplasia with with radiation changes and minimal obstruction no recurrent stones. Bladder: smooth without mucosal lesions with the exception of a 54mm ovoid lesion on the posterior wall that appears most consistent with a vascular anomaly and is less prominent than on prior cystoscopy.   Ureteral orifices: normal  Complications: none.     Assessment & Plan: Membranous stricture.  There is mild recurrent stricturing that I disrupted with the scope.  Bladder and prostatic urethral dystrophic calcification have resolved with removal and medical therapy.[name]   He will continue the high fluid intake and polycitra.   History of bladder cancer.   Cystoscopy is clear today except for a tiny red spot on the posterior wall that is  improved and appears to just be a vessel.   He will return in 6 months for cystoscopy.  With his intermediate risk low grade disease I don't think he needs further maintenance BCG.    History of prostate cancer.  PSA is stable over the last 6 months at 0.9.   Repeat in 6 months.    BOO.  He is off of the tamsulosin.      No orders of the defined types were placed in this encounter.    No orders of the defined types were placed in this encounter.     No follow-ups on file.   CC: Dhivianathan, Candida Peeling, MD      Irine Seal 10/16/2021 Patient ID: Jack Mcdonald, male   DOB: [birth year], 65 y.o.   MRN: [id number] Patient ID: Jack Mcdonald, male   DOB: July 21, 1956, 65 y.o.   MRN: [id number]

## 2021-10-16 NOTE — Progress Notes (Signed)

## 2021-10-31 IMAGING — CT CT ABD-PEL WO/W CM
4 of 12 series · 11 of 46 positions shown, 17 images · IV contrast (omnipaque)
Comparison: 07/18/2015

CLINICAL DATA: Bladder tumor, gross hematuria.

EXAM:
CT ABDOMEN AND PELVIS WITHOUT AND WITH CONTRAST
TECHNIQUE: Multidetector CT imaging of the abdomen and pelvis was performed
following the standard protocol before and following the bolus
administration of intravenous contrast.
CONTRAST:  125mL OMNIPAQUE IOHEXOL 300 MG/ML  SOLN

[Series 2: axial pre · axial · non-contrast · 0.87mm/px · z∈[+844,+1100]mm · 3 of 103 slices shown]
[im 26/103  soft-tissue]
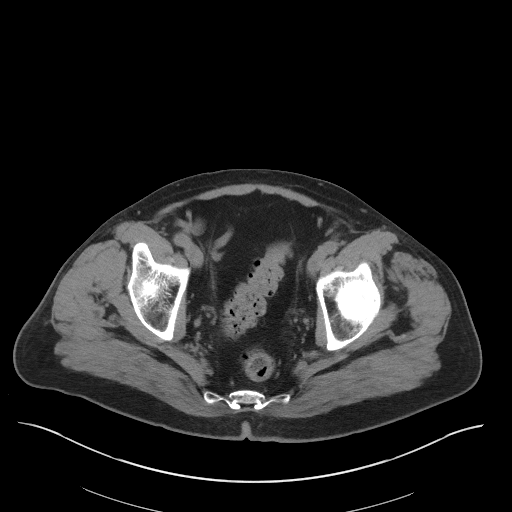
[im 52/103  soft-tissue]
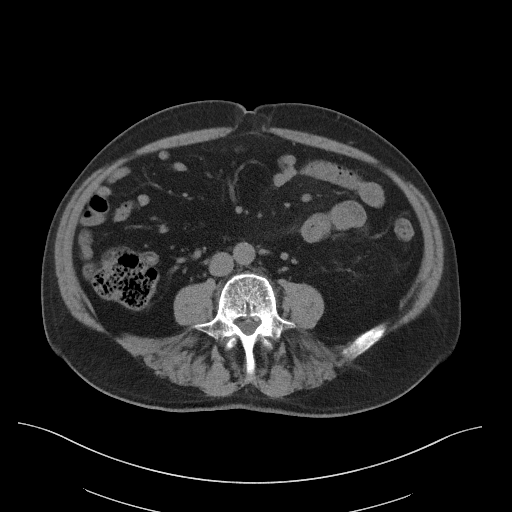
[im 77/103  soft-tissue]
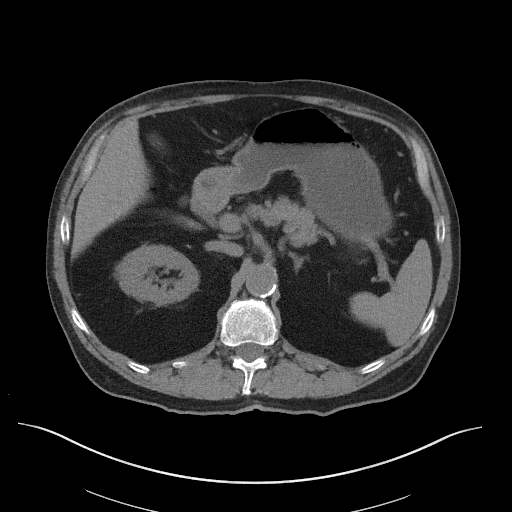

[Series 5: coronal pre · coronal · non-contrast · 0.82mm/px · 2 of 112 slices shown, 3 images]
[im 38/112  soft-tissue]
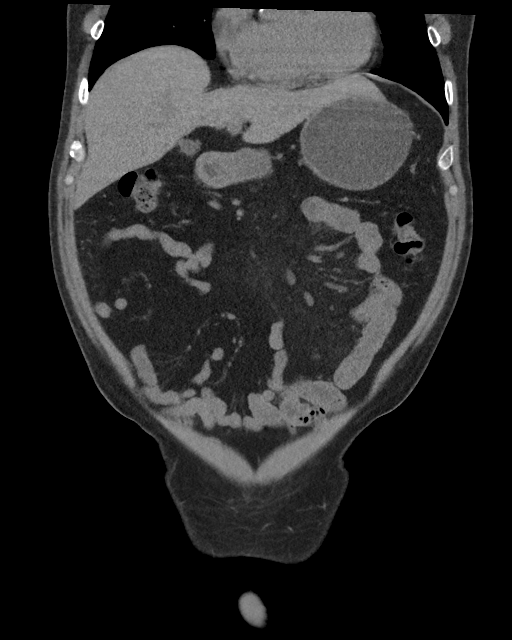
[im 38/112  bone]
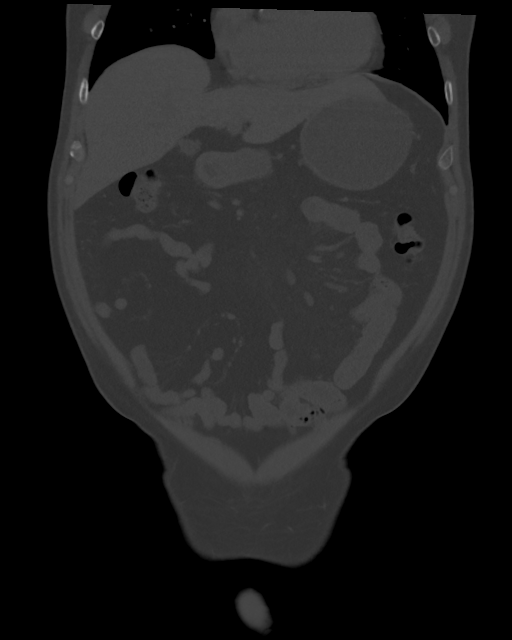
[im 75/112  soft-tissue]
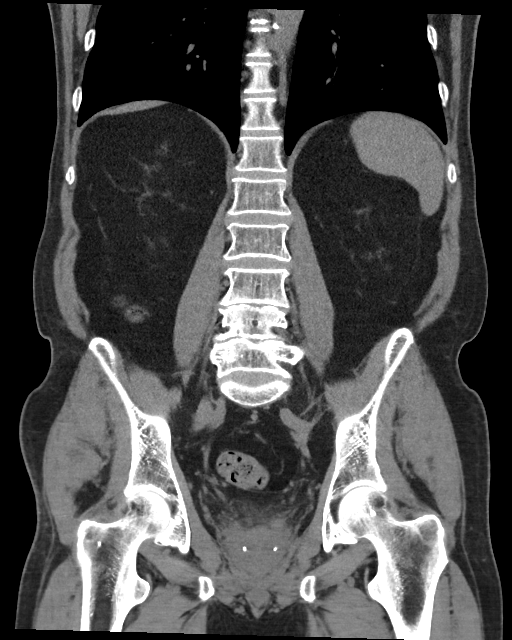

[Series 8: axial post · axial · 0.87mm/px · z∈[+844,+974]mm · 2 of 103 slices shown]
[im 26/103  soft-tissue]
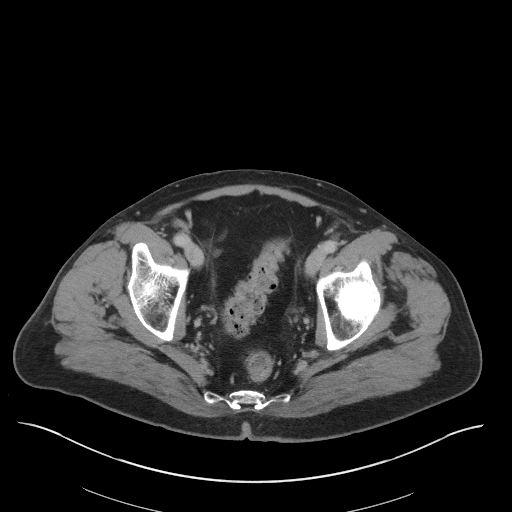
[im 52/103  soft-tissue]
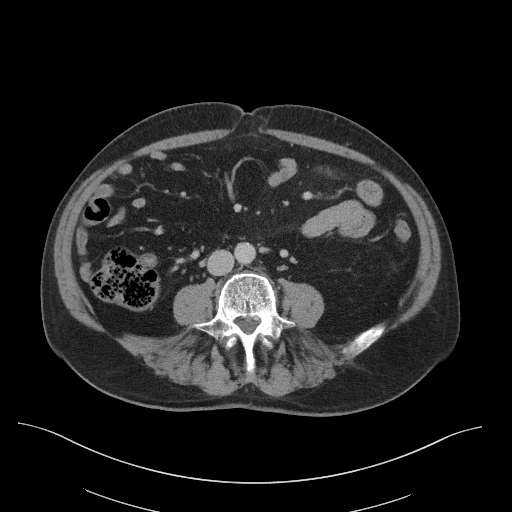

[Series 13: axial delay · axial · delayed · 0.91mm/px · z∈[+825,+1160]mm · 4 of 113 slices shown, 9 images]
[im 23/113  soft-tissue]
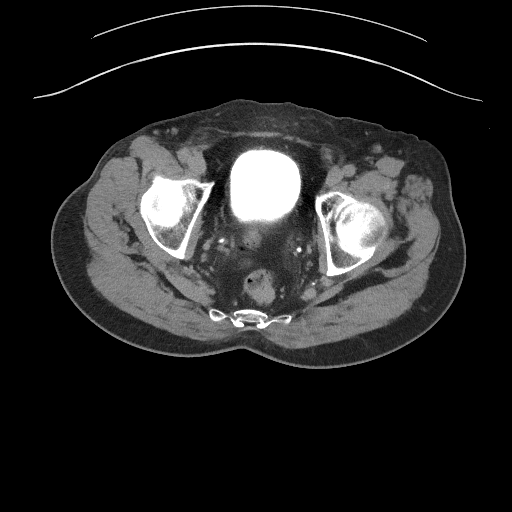
[im 23/113  lung]
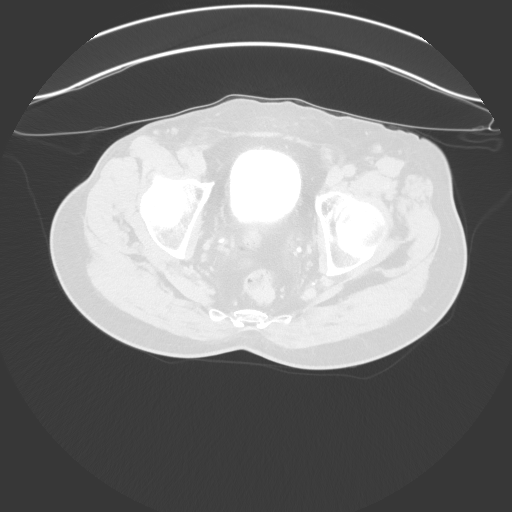
[im 23/113  bone]
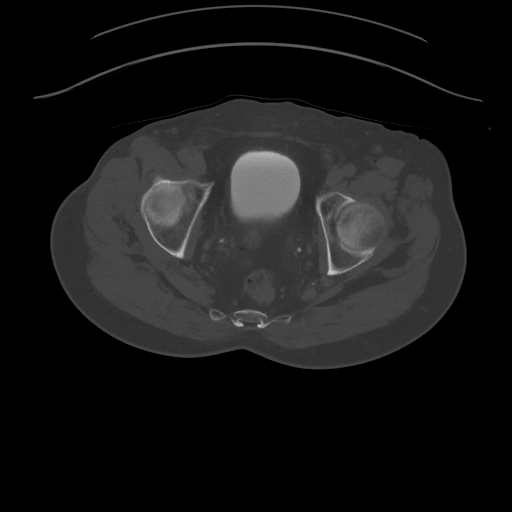
[im 45/113  soft-tissue]
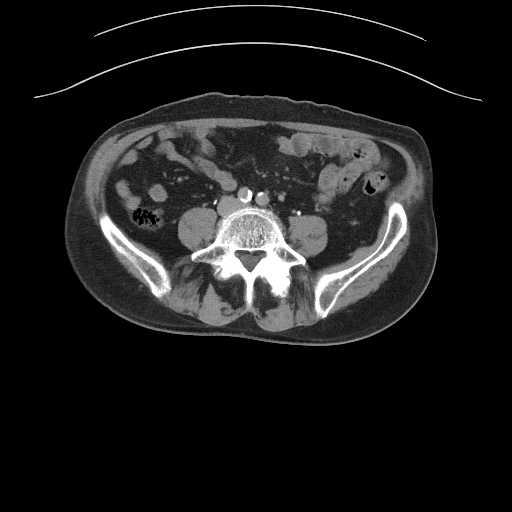
[im 45/113  lung]
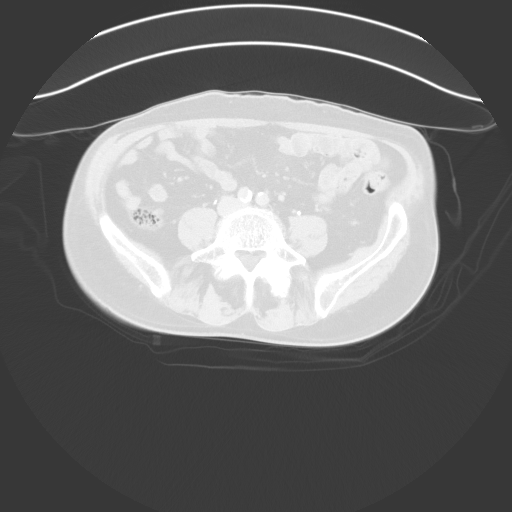
[im 68/113  soft-tissue]
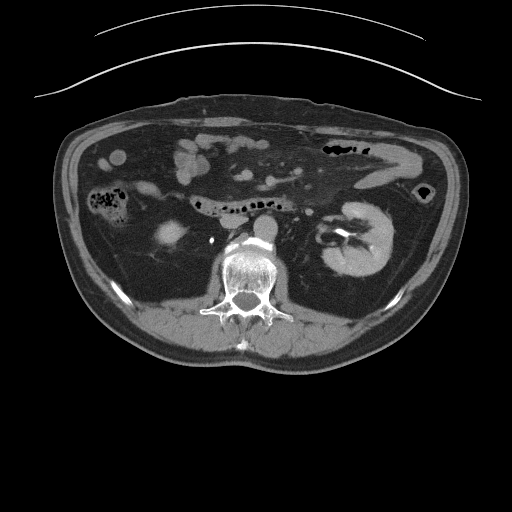
[im 68/113  lung]
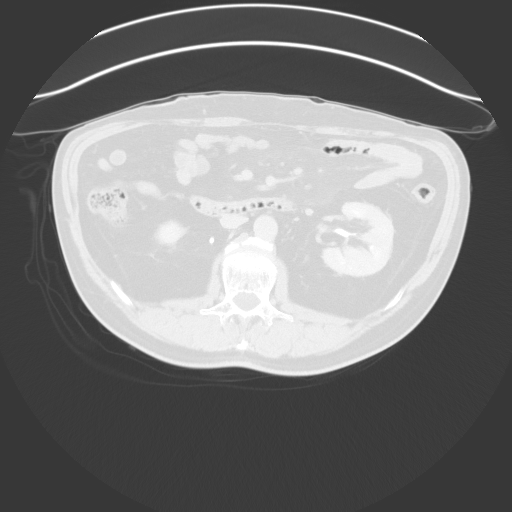
[im 90/113  soft-tissue]
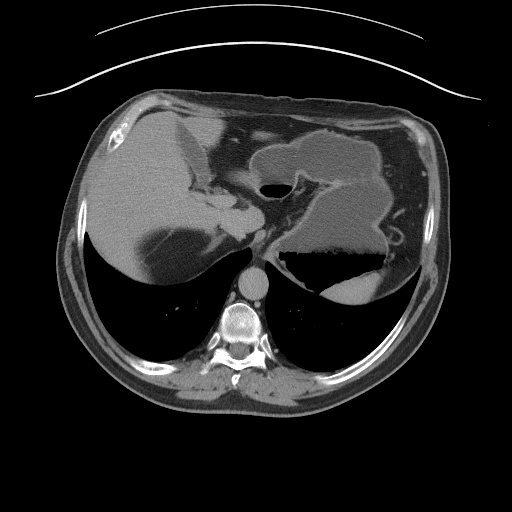
[im 90/113  lung]
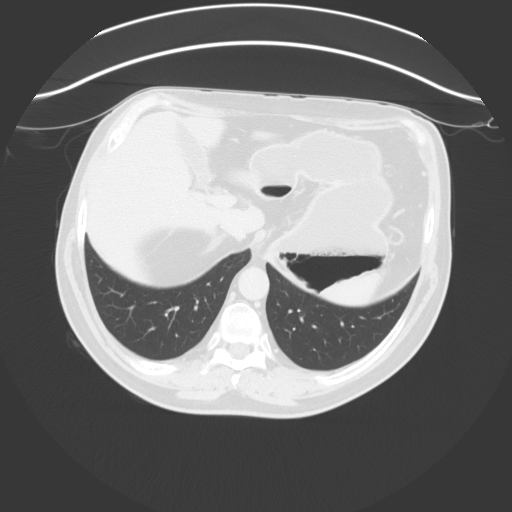

[11 of 46 positions shown; findings below may reference images not displayed]

FINDINGS: Lower chest: Descending thoracic aortic atherosclerotic
calcification.

Hepatobiliary: Unremarkable

Pancreas: Unremarkable

Spleen: Unremarkable

Adrenals/Urinary Tract: Both adrenal glands appear normal.

Small hypodense lesions both kidneys, mainly in the upper poles, are
statistically highly likely to be cysts but technically nonspecific
due to small size. Most were present on the 07/18/2015 exam.

No significant worrisome are abnormal parenchymal enhancement in the
kidneys.

3 mm right mid kidney nonobstructive renal calculus and separate 2
mm right kidney lower pole nonobstructive renal calculus on image
53/5. Punctate 1-2 mm nonobstructive left kidney upper pole and left
mid kidney renal calculi are identified. No hydronephrosis or
hydroureter. No ureteral or bladder calculus identified.

Filling defect along the base the bladder could be from the
indentation from the prostate gland, or less likely tumor. There is
layering of contrast in the urinary bladder with some of the
posterior urinary bladder not opacified on the delayed images.
Currently I do not identify any obvious filling defect along the
urothelium to indicate urothelial tumor appreciable by CT. No
unexpected filling defect along the renal collecting systems or
ureters.

Stomach/Bowel: Unremarkable

Vascular/Lymphatic: Aortoiliac atherosclerotic vascular disease.

Reproductive: Fiducials along the prostate gland.

Other: Stable chronic stranding in the mesenteric probably from
low-grade sclerosing mesenteritis.

Musculoskeletal: Lumbar spondylosis and degenerative disc disease
causing impingement at L3-4, L4-5, and possibly L2-3 as well.
Suspicion for a disc protrusion at the T7-8 level.

Umbilical hernia contains adipose tissue.
IMPRESSION: 1. Filling defect along the base of the bladder is probably from the
indentation from the prostate gland, less likely urothelial tumor.
No other unexpected filling defect along the urothelium.
2. Bilateral nonobstructive nephrolithiasis.
3. Aortic atherosclerosis.
4. Lumbar spondylosis and degenerative disc disease causing
multilevel impingement. Possible disc protrusion at T7-8.
5. Umbilical hernia contains adipose tissue.

Aortic Atherosclerosis (L8IKB-GDI.I).

## 2021-11-10 ENCOUNTER — Ambulatory Visit (INDEPENDENT_AMBULATORY_CARE_PROVIDER_SITE_OTHER): Payer: Medicare Other | Admitting: Gastroenterology

## 2021-11-10 ENCOUNTER — Encounter: Payer: Self-pay | Admitting: Gastroenterology

## 2021-11-10 VITALS — BP 116/64 | HR 72 | Ht 69.5 in | Wt 213.2 lb

## 2021-11-10 DIAGNOSIS — K2100 Gastro-esophageal reflux disease with esophagitis, without bleeding: Secondary | ICD-10-CM | POA: Diagnosis not present

## 2021-11-10 DIAGNOSIS — Z86010 Personal history of colonic polyps: Secondary | ICD-10-CM | POA: Diagnosis not present

## 2021-11-10 MED ORDER — PANTOPRAZOLE SODIUM 40 MG PO TBEC: 40.0000 mg | DELAYED_RELEASE_TABLET | Freq: Every day | ORAL | 3 refills | Status: DC

## 2021-11-10 NOTE — Progress Notes (Signed)
    History of Present Illness: This is a 65 year old male with LA Grade B esophagitis and a history of an esophageal stricture.  He relates only rare episodes of acid reflux usually associated with certain foods.  He has had no dysphagia since his dilation last year.  No other gastrointestinal complaints.  EGD 08/2020 - LA Grade B reflux esophagitis with no bleeding. - Benign-appearing esophageal stenosis. Dilated. - Erythematous mucosa in the prepyloric region of the stomach. Biopsied. - Two mucosal nodules found in the duodenum. Biopsied. Path: peptic duodenitis, mild reactive gastropathy  Current Medications, Allergies, Past Medical History, Past Surgical History, Family History and Social History were reviewed in [employer] record.   Physical Exam: General: Well developed, well nourished, no acute distress Head: Normocephalic and atraumatic Eyes: Sclerae anicteric, EOMI Ears: Normal auditory acuity Mouth: Not examined, mask on during Covid-19 pandemic Lungs: Clear throughout to auscultation Heart: Regular rate and rhythm; no murmurs, rubs or bruits Abdomen: Soft, non tender and non distended. No masses, hepatosplenomegaly or hernias noted. Normal Bowel sounds Rectal: Not done Musculoskeletal: Symmetrical with no gross deformities  Pulses:  Normal pulses noted Extremities: No clubbing, cyanosis, edema or deformities noted Neurological: Alert oriented x 4, grossly nonfocal Psychological:  Alert and cooperative. Normal mood and affect   Assessment and Recommendations:  LA Grade B esophagitis and a history of an esophageal stricture.  Continue pantoprazole 40 mg p.o. daily.  Follow antireflux measures.  Tums as needed. REV in 1 year. Personal history of adenomatous colon polyps.  A 7-year interval surveillance colonoscopy is recommended in October 2028.

## 2021-11-10 NOTE — Patient Instructions (Signed)
We have sent the following prescriptions to your mail in pharmacy: pantoprazole.   If you have not heard from your mail in pharmacy within 1 week or if you have not received your medication in the mail, please contact us at [phone] so we may find out why.  The [facility] providers would like to encourage you to use MYCHART to communicate with providers for non-urgent requests or questions.  Due to long hold times on the telephone, sending your provider a message by MYCHART may be a faster and more efficient way to get a response.  Please allow 48 business hours for a response.  Please remember that this is for non-urgent requests.   Thank you for choosing me and [facility].  Malcolm T. Stark, Jr., MD., FACG  

## 2022-04-09 ENCOUNTER — Other Ambulatory Visit: Payer: Medicare Other

## 2022-04-10 LAB — PSA: Prostate Specific Ag, Serum: 1.1 ng/mL (ref 0.0–4.0)

## 2022-04-16 ENCOUNTER — Encounter: Payer: Self-pay | Admitting: Urology

## 2022-04-16 ENCOUNTER — Ambulatory Visit (INDEPENDENT_AMBULATORY_CARE_PROVIDER_SITE_OTHER): Payer: Medicare Other | Admitting: Urology

## 2022-04-16 VITALS — BP 137/81 | HR 73 | Ht 70.0 in | Wt 200.0 lb

## 2022-04-16 DIAGNOSIS — Z87442 Personal history of urinary calculi: Secondary | ICD-10-CM | POA: Diagnosis not present

## 2022-04-16 DIAGNOSIS — Z8546 Personal history of malignant neoplasm of prostate: Secondary | ICD-10-CM

## 2022-04-16 DIAGNOSIS — Z8551 Personal history of malignant neoplasm of bladder: Secondary | ICD-10-CM

## 2022-04-16 DIAGNOSIS — N210 Calculus in bladder: Secondary | ICD-10-CM

## 2022-04-16 DIAGNOSIS — N99112 Postprocedural membranous urethral stricture: Secondary | ICD-10-CM | POA: Diagnosis not present

## 2022-04-16 LAB — URINALYSIS, ROUTINE W REFLEX MICROSCOPIC
Bilirubin, UA: NEGATIVE
Glucose, UA: NEGATIVE
Ketones, UA: NEGATIVE
Leukocytes,UA: NEGATIVE
Nitrite, UA: NEGATIVE
Protein,UA: NEGATIVE
RBC, UA: NEGATIVE
Specific Gravity, UA: 1.02 (ref 1.005–1.030)
Urobilinogen, Ur: 2 mg/dL — ABNORMAL HIGH (ref 0.2–1.0)
pH, UA: 7.5 (ref 5.0–7.5)

## 2022-04-16 MED ORDER — CIPROFLOXACIN HCL 500 MG PO TABS
500.0000 mg | ORAL_TABLET | Freq: Once | ORAL | Status: AC
  Administered 2022-04-16: 500 mg via ORAL

## 2022-04-16 MED ORDER — POTASSIUM CITRATE-CITRIC ACID 1100-334 MG/5ML PO SOLN: ORAL | 11 refills | Status: DC

## 2022-04-16 NOTE — Progress Notes (Signed)
Subjective:  1. History of prostate cancer   2. History of bladder cancer   3. Postprocedural membranous urethral stricture, male   4. 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  He had BCG induction and a year of maintenance.  He has a history of a membranous stricture.  He has a history of prostate cancer diagnosed in 2014 and treated with radiation therapy.  He had a TURP in 01/11/20 for dystrophic calcifications of the prostatic urethra and has remained clear on polycitra K and high fluid intake.  His PSA was stable at 1.1 prior to this visit.  His UA is clear.   10/16/21: Graciano returns today in f/u for surveillance cystoscopy.  His cytology from 8/18 was negative. He had  a small red spot on the bladder wall so he is brought back for a 3 months cysto instead of 6 months.  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   07/17/21: Tylek returns today for the history noted below for surveillance cystoscopy.  He had a PSA prior to this visit that was slightly lower at 0.9.   He reports a slower stream and hesitancy and he feels that the stricture maybe recurring.   His UA is clear today and he has had no hematuria.     .     ROS:  ROS:  A complete review of systems was performed.  All systems are negative except for pertinent findings as noted.   ROS  No Known Allergies   Outpatient Encounter Medications as of 04/16/2022  Medication Sig   hydrochlorothiazide (MICROZIDE) 12.5 MG capsule Take 12.5 mg by mouth daily.    metoprolol tartrate (LOPRESSOR) 25 MG tablet Take 12.5 mg by mouth 2 (two) times daily.    pantoprazole (PROTONIX) 40 MG tablet Take 1 tablet (40 mg total) by mouth daily.   telmisartan (MICARDIS) 40 MG tablet Take 40 mg by mouth daily.   [DISCONTINUED] citric acid-potassium citrate (POLYCITRA) 1100-334 MG/5ML solution TAKE 1 TEASPOONFUL  (10 MEQ) BY MOUTH THREE TIMES DAILY   citric acid-potassium citrate (POLYCITRA) 1100-334 MG/5ML solution TAKE 1 TEASPOONFUL (10 MEQ) BY MOUTH THREE TIMES DAILY   Facility-Administered Encounter Medications as of 04/16/2022  Medication   [COMPLETED] ciprofloxacin (CIPRO) tablet 500 mg   gemcitabine (GEMZAR) chemo syringe for bladder instillation 2,000 mg    Past Medical History:  Diagnosis Date   Bladder cancer ([place])    imm therapy    BPH with urinary obstruction    urologist--- dr Jeffie Pollock   Dysphagia    01-05-2020  per pt states chews well and small bites   History of external beam radiation therapy    prostate cancer--- completed 2014   History of kidney stones    History of prostate cancer urologist--- dr Jeffie Pollock   dx 02-21-2013   Stage T2a, Gleason 3+4;   completed radiation   Hypertension    followed by cardiology-- Dr Carrolyn Leigh ([place])   per cardiololgy note pt had ETT 03/ 2018 negative for ishcemia   Moderate aortic regurgitation    per 06-24-19 echo dr Lennette Bihari [facility]  (scanned in media in epic)   Nocturia    Palpitations    followed by cardiologist--- dr Raliegh Ip. Gibson Ramp;  per dr Gibson Ramp note , holter monior 01/ 2018 infrequent supraventricular and ventricular ectopic activity  Prostate calculus    Prostate cancer ([place]) 01/04/13   Adenocarcinoma,gleason=3+4=7,& 3+3=6,PSA=3.94,Volume=63cc   S/P TURP    Tubular adenoma of colon 2017   Urethral stricture    Wears glasses     Past Surgical History:  Procedure Laterality Date   COLONOSCOPY  age 34   few polyps   CYSTOSCOPY WITH URETHRAL DILATATION N/A 01/11/2020   Procedure: CYSTOSCOPY WITH URETHRAL DILATATION AND LITHOLAPAXY;  Surgeon: Irine Seal, MD;  Location: [facility];  Service: Urology;  Laterality: N/A;   PROSTATE BIOPSY  01/04/13   Adenocarncinoma   TRANSURETHRAL RESECTION OF BLADDER TUMOR WITH MITOMYCIN-C Bilateral 11/09/2019   Procedure:  TRANSURETHRAL RESECTION OF BLADDER TUMOR;  Surgeon: Irine Seal, MD;  Location: [facility];  Service: Urology;  Laterality: Bilateral;   TRANSURETHRAL RESECTION OF PROSTATE N/A 01/11/2020   Procedure: TRANSURETHRAL RESECTION REMOVAL OF STONES;  Surgeon: Irine Seal, MD;  Location: [facility];  Service: Urology;  Laterality: N/A;    Social History   Socioeconomic History   Marital status: Married    Spouse name: Not on file   Number of children: 1   Years of education: Not on file   Highest education level: Not on file  Occupational History   Occupation: Curator  Tobacco Use   Smoking status: Never   Smokeless tobacco: Never  Vaping Use   Vaping Use: Never used  Substance and Sexual Activity   Alcohol use: Yes    Comment: 2 per day mixed drinks   Drug use: No   Sexual activity: Yes  Other Topics Concern   Not on file  Social History Narrative   Not on file   Social Determinants of Health   Financial Resource Strain: Not on file  Food Insecurity: Not on file  Transportation Needs: Not on file  Physical Activity: Not on file  Stress: Not on file  Social Connections: Not on file  Intimate Partner Violence: Not on file    Family History  Problem Relation Age of Onset   Colon cancer Neg Hx    Colon polyps Neg Hx    Rectal cancer Neg Hx    Stomach cancer Neg Hx    Pancreatic cancer Neg Hx    Esophageal cancer Neg Hx        Objective: Vitals:   04/16/22 1019  BP: 137/81  Pulse: 73     Physical Exam  Lab Results:  Results for orders placed or performed in visit on 04/16/22 (from the past 24 hour(s))  Urinalysis, Routine w reflex microscopic     Status: Abnormal   Collection Time: 04/16/22 10:05 AM  Result Value Ref Range   Specific Gravity, UA 1.020 1.005 - 1.030   pH, UA 7.5 5.0 - 7.5   Color, UA Yellow Yellow   Appearance Ur Clear Clear   Leukocytes,UA Negative Negative   Protein,UA Negative Negative/Trace   Glucose,  UA Negative Negative   Ketones, UA Negative Negative   RBC, UA Negative Negative   Bilirubin, UA Negative Negative   Urobilinogen, Ur 2.0 (H) 0.2 - 1.0 mg/dL   Nitrite, UA Negative Negative   Microscopic Examination Comment    Narrative   Performed at:  [facility] [address] [place], [place]  [id number] Lab Director: Mina Marble MT, Phone:  [phone]    BMET No results for input(s): NA, K, CL, CO2, GLUCOSE, BUN, CREATININE, CALCIUM in the last 72 hours. PSA Recent Results (from the past 2160  hour(s))  PSA     Status: None   Collection Time: 04/09/22 10:05 AM  Result Value Ref Range   Prostate Specific Ag, Serum 1.1 0.0 - 4.0 ng/mL    Comment: Roche ECLIA methodology. According to the American Urological Association, Serum PSA should decrease and remain at undetectable levels after radical prostatectomy. The AUA defines biochemical recurrence as an initial PSA value 0.2 ng/mL or greater followed by a subsequent confirmatory PSA value 0.2 ng/mL or greater. Values obtained with different assay methods or kits cannot be used interchangeably. Results cannot be interpreted as absolute evidence of the presence or absence of malignant disease.   Urinalysis, Routine w reflex microscopic     Status: Abnormal   Collection Time: 04/16/22 10:05 AM  Result Value Ref Range   Specific Gravity, UA 1.020 1.005 - 1.030   pH, UA 7.5 5.0 - 7.5   Color, UA Yellow Yellow   Appearance Ur Clear Clear   Leukocytes,UA Negative Negative   Protein,UA Negative Negative/Trace   Glucose, UA Negative Negative   Ketones, UA Negative Negative   RBC, UA Negative Negative   Bilirubin, UA Negative Negative   Urobilinogen, Ur 2.0 (H) 0.2 - 1.0 mg/dL   Nitrite, UA Negative Negative   Microscopic Examination Comment     Comment: Microscopic follows if indicated.    Lab Results  Component Value Date   PSA1 1.1 04/09/2022   PSA1 0.9 07/10/2021   PSA1 1.0 01/09/2021    UA is  clear.   Report entry not complete.   Studies/Results: Cystoscopy:   He was prepped with betadine, draped with sterile towels.  He was given cipro to cover the procedure.  The well lubricated flexible scope was passed.  Urethra: normal with the exception of a mild membranous stricture.  I was able to pass the scope and disrupt the stricture to some degree. . Prostate: Bilobar hyperplasia with with radiation changes and minimal obstruction no recurrent stones. Bladder: smooth without mucosal lesions other than radiation neovascularity at the bladder neck.    Ureteral orifices: normal  Complications: none.     Assessment & Plan: Membranous stricture.  There is mild recurrent stricturing that didn't impede the scope passage.  Bladder and prostatic urethral dystrophic calcification have resolved with removal and medical therapy.[name]   He will continue the high fluid intake and polycitra.   History of bladder cancer.   No recurrences noted but he has some radiation changes.  He will return in 6 months for cystoscopy.  With his intermediate risk low grade disease I don't think he needs further maintenance BCG.    History of prostate cancer.  PSA is stable over the last 6 months at 1.1.   Repeat in 6 months.    BOO.  He is off of the tamsulosin.      Meds ordered this encounter  Medications   ciprofloxacin (CIPRO) tablet 500 mg   citric acid-potassium citrate (POLYCITRA) 1100-334 MG/5ML solution    Sig: TAKE 1 TEASPOONFUL (10 MEQ) BY MOUTH THREE TIMES DAILY    Dispense:  473 mL    Refill:  11      Orders Placed This Encounter  Procedures   Urinalysis, Routine w reflex microscopic   PSA    Standing Status:   Future    Standing Expiration Date:   04/17/2023       Return in about 6 months (around 10/17/2022) for with PSA for cystoscopy..   CC: Dhivianathan, Candida Peeling, MD  Irine Seal 04/16/2022 Patient ID: Anastasia Fiedler, male   DOB: 03-May-1956, 66 y.o.   MRN:  [id number] Patient ID: Vicki Pasqual, male   DOB: [birth year], 66 y.o.   MRN: [id number]

## 2022-09-21 ENCOUNTER — Other Ambulatory Visit: Payer: Self-pay | Admitting: Urology

## 2022-09-21 DIAGNOSIS — N210 Calculus in bladder: Secondary | ICD-10-CM

## 2022-10-21 ENCOUNTER — Other Ambulatory Visit: Payer: Medicare Other

## 2022-10-21 DIAGNOSIS — Z8546 Personal history of malignant neoplasm of prostate: Secondary | ICD-10-CM

## 2022-10-22 LAB — PSA: Prostate Specific Ag, Serum: 0.9 ng/mL (ref 0.0–4.0)

## 2022-10-29 ENCOUNTER — Encounter: Payer: Self-pay | Admitting: Urology

## 2022-10-29 ENCOUNTER — Ambulatory Visit (INDEPENDENT_AMBULATORY_CARE_PROVIDER_SITE_OTHER): Payer: Medicare Other | Admitting: Urology

## 2022-10-29 VITALS — BP 130/76 | HR 71

## 2022-10-29 DIAGNOSIS — N99112 Postprocedural membranous urethral stricture: Secondary | ICD-10-CM | POA: Diagnosis not present

## 2022-10-29 DIAGNOSIS — Z8551 Personal history of malignant neoplasm of bladder: Secondary | ICD-10-CM

## 2022-10-29 DIAGNOSIS — Z87442 Personal history of urinary calculi: Secondary | ICD-10-CM | POA: Diagnosis not present

## 2022-10-29 DIAGNOSIS — N210 Calculus in bladder: Secondary | ICD-10-CM

## 2022-10-29 DIAGNOSIS — Z8546 Personal history of malignant neoplasm of prostate: Secondary | ICD-10-CM | POA: Diagnosis not present

## 2022-10-29 LAB — URINALYSIS, ROUTINE W REFLEX MICROSCOPIC
Bilirubin, UA: NEGATIVE
Glucose, UA: NEGATIVE
Ketones, UA: NEGATIVE
Leukocytes,UA: NEGATIVE
Nitrite, UA: NEGATIVE
Protein,UA: NEGATIVE
RBC, UA: NEGATIVE
Specific Gravity, UA: 1.02 (ref 1.005–1.030)
Urobilinogen, Ur: 1 mg/dL (ref 0.2–1.0)
pH, UA: 8.5 — ABNORMAL HIGH (ref 5.0–7.5)

## 2022-10-29 MED ORDER — POTASSIUM CITRATE-CITRIC ACID 1100-334 MG/5ML PO SOLN: ORAL | 1 refills | Status: DC

## 2022-10-29 MED ORDER — CIPROFLOXACIN HCL 500 MG PO TABS
500.0000 mg | ORAL_TABLET | Freq: Once | ORAL | Status: AC
  Administered 2022-10-29: 500 mg via ORAL

## 2022-10-29 NOTE — Addendum Note (Signed)
Addended by: Darcella Gasman R on: 10/29/2022 10:56 AM   Modules accepted: Orders

## 2022-10-29 NOTE — Progress Notes (Addendum)
Subjective:  1. History of bladder cancer   2. History of prostate cancer   3. Bladder stones   4. Postprocedural membranous urethral stricture, male     10/29/22: Jack Mcdonald returns today in f/u for cystoscopy for the history below. His PSA is stable at 0.9.  His UA is clear.  He is doing well without associated signs or symptoms.   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  He had BCG induction and a year of maintenance.  He has a history of a membranous stricture.  He has a history of prostate cancer diagnosed in 2014 and treated with radiation therapy.  He had a TURP in 01/11/20 for dystrophic calcifications of the prostatic urethra and has remained clear on polycitra K and high fluid intake.  His PSA was stable at 1.1 prior to this visit.  His UA is clear.   10/16/21: Jack Mcdonald returns today in f/u for surveillance cystoscopy.  His cytology from 8/18 was negative. He had  a small red spot on the bladder wall so he is brought back for a 3 months cysto instead of 6 months.  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   07/17/21: Jack Mcdonald returns today for the history noted below for surveillance cystoscopy.  He had a PSA prior to this visit that was slightly lower at 0.9.   He reports a slower stream and hesitancy and he feels that the stricture maybe recurring.   His UA is clear today and he has had no hematuria.        ROS:  ROS:  A complete review of systems was performed.  All systems are negative except for pertinent findings as noted.   ROS  No Known Allergies   Outpatient Encounter Medications as of 10/29/2022  Medication Sig   hydrochlorothiazide (MICROZIDE) 12.5 MG capsule Take 12.5 mg by mouth daily.    metoprolol tartrate (LOPRESSOR) 25 MG tablet Take 12.5 mg by mouth 2 (two) times daily.    pantoprazole (PROTONIX) 40 MG tablet Take 1 tablet (40 mg total) by mouth  daily.   telmisartan (MICARDIS) 40 MG tablet Take 40 mg by mouth daily.   [DISCONTINUED] citric acid-potassium citrate (POLYCITRA) 1100-334 MG/5ML solution TAKE 1 TEASPOONFUL THREE TIMES A DAY   citric acid-potassium citrate (POLYCITRA) 1100-334 MG/5ML solution TAKE 1 TEASPOONFUL THREE TIMES A DAY   Facility-Administered Encounter Medications as of 10/29/2022  Medication   gemcitabine (GEMZAR) chemo syringe for bladder instillation 2,000 mg    Past Medical History:  Diagnosis Date   Bladder cancer ([place])    imm therapy    BPH with urinary obstruction    urologist--- dr Jeffie Pollock   Dysphagia    01-05-2020  per pt states chews well and small bites   History of external beam radiation therapy    prostate cancer--- completed 2014   History of kidney stones    History of prostate cancer urologist--- dr Jeffie Pollock   dx 02-21-2013   Stage T2a, Gleason 3+4;   completed radiation   Hypertension    followed by cardiology-- Dr Carrolyn Leigh ([place])   per cardiololgy note pt had ETT 03/ 2018 negative for ishcemia   Moderate aortic regurgitation    per 06-24-19 echo dr Lennette Bihari [facility]  (scanned in media in epic)   Nocturia    Palpitations    followed by cardiologist---  dr Milinda Cave;  per dr Gibson Ramp note , holter monior 01/ 2018 infrequent supraventricular and ventricular ectopic activity   Prostate calculus    Prostate cancer ([place]) 01/04/13   Adenocarcinoma,gleason=3+4=7,& 3+3=6,PSA=3.94,Volume=63cc   S/P TURP    Tubular adenoma of colon 2017   Urethral stricture    Wears glasses     Past Surgical History:  Procedure Laterality Date   COLONOSCOPY  age 11   few polyps   CYSTOSCOPY WITH URETHRAL DILATATION N/A 01/11/2020   Procedure: CYSTOSCOPY WITH URETHRAL DILATATION AND LITHOLAPAXY;  Surgeon: Irine Seal, MD;  Location: [facility];  Service: Urology;  Laterality: N/A;   PROSTATE BIOPSY  01/04/13   Adenocarncinoma   TRANSURETHRAL  RESECTION OF BLADDER TUMOR WITH MITOMYCIN-C Bilateral 11/09/2019   Procedure: TRANSURETHRAL RESECTION OF BLADDER TUMOR;  Surgeon: Irine Seal, MD;  Location: [facility];  Service: Urology;  Laterality: Bilateral;   TRANSURETHRAL RESECTION OF PROSTATE N/A 01/11/2020   Procedure: TRANSURETHRAL RESECTION REMOVAL OF STONES;  Surgeon: Irine Seal, MD;  Location: [facility];  Service: Urology;  Laterality: N/A;    Social History   Socioeconomic History   Marital status: Married    Spouse name: Not on file   Number of children: 1   Years of education: Not on file   Highest education level: Not on file  Occupational History   Occupation: Curator  Tobacco Use   Smoking status: Never   Smokeless tobacco: Never  Vaping Use   Vaping Use: Never used  Substance and Sexual Activity   Alcohol use: Yes    Comment: 2 per day mixed drinks   Drug use: No   Sexual activity: Yes  Other Topics Concern   Not on file  Social History Narrative   Not on file   Social Determinants of Health   Financial Resource Strain: Not on file  Food Insecurity: Not on file  Transportation Needs: Not on file  Physical Activity: Not on file  Stress: Not on file  Social Connections: Not on file  Intimate Partner Violence: Not on file    Family History  Problem Relation Age of Onset   Colon cancer Neg Hx    Colon polyps Neg Hx    Rectal cancer Neg Hx    Stomach cancer Neg Hx    Pancreatic cancer Neg Hx    Esophageal cancer Neg Hx        Objective: Vitals:   10/29/22 0914  BP: 130/76  Pulse: 71      Physical Exam  Lab Results:  No results found for this or any previous visit (from the past 24 hour(s)).   BMET No results for input(s): "NA", "K", "CL", "CO2", "GLUCOSE", "BUN", "CREATININE", "CALCIUM" in the last 72 hours. PSA Recent Results (from the past 2160 hour(s))  PSA     Status: None   Collection Time: 10/21/22  9:02 AM  Result Value Ref Range    Prostate Specific Ag, Serum 0.9 0.0 - 4.0 ng/mL    Comment: Roche ECLIA methodology. According to the American Urological Association, Serum PSA should decrease and remain at undetectable levels after radical prostatectomy. The AUA defines biochemical recurrence as an initial PSA value 0.2 ng/mL or greater followed by a subsequent confirmatory PSA value 0.2 ng/mL or greater. Values obtained with different assay methods or kits cannot be used interchangeably. Results cannot be interpreted as absolute evidence of the presence or absence of malignant disease.     Lab Results  Component Value Date   PSA1 0.9 10/21/2022   PSA1 1.1 04/09/2022   PSA1 0.9 07/10/2021    UA is clear.   Report entry not complete.   Studies/Results: Cystoscopy:   He was prepped with betadine, draped with sterile towels.  He was given cipro to cover the procedure.  The well lubricated flexible scope was passed.  Urethra: normal with the exception of a mild membranous stricture.  I was able to pass the scope and disrupt the stricture to some degree. . Prostate: Bilobar hyperplasia with with radiation changes and minimal obstruction no recurrent stones. Bladder: smooth without mucosal lesions other than radiation neovascularity at the bladder neck.    Ureteral orifices: normal  Complications: none.     Assessment & Plan: Membranous stricture.  There is mild recurrent stricturing that didn't impede the scope passage.  Bladder and prostatic urethral dystrophic calcification have resolved with removal and medical therapy.[name]   He will continue the high fluid intake and polycitra.   History of bladder cancer.   No recurrences noted but he has some radiation changes.  He will return in 12 months for cystoscopy.  With his intermediate risk low grade disease I don't think he needs further maintenance BCG.    History of prostate cancer.  PSA is stable over the last 6 months at 0.9.   Repeat in a year.    BOO.  He  is off of the tamsulosin.      Meds ordered this encounter  Medications   citric acid-potassium citrate (POLYCITRA) 1100-334 MG/5ML solution    Sig: TAKE 1 TEASPOONFUL THREE TIMES A DAY    Dispense:  1419 mL    Refill:  1    PATIENT WANTS 90 DAY SUPPLY      Orders Placed This Encounter  Procedures   Urinalysis, Routine w reflex microscopic   PSA    Standing Status:   Future    Standing Expiration Date:   10/30/2023       Return in about 1 year (around 10/30/2023) for with PSA for cystoscopy. .   CC: Dhivianathan, Candida Peeling, MD      Irine Seal 10/29/2022 Patient ID: Anastasia Fiedler, male   DOB: June 12, 1956, 66 y.o.   MRN: [id number] Patient ID: Momen Ham, male   DOB: December 20, 1955, 66 y.o.   MRN: [id number]

## 2022-11-07 ENCOUNTER — Other Ambulatory Visit: Payer: Self-pay | Admitting: Gastroenterology

## 2022-12-28 ENCOUNTER — Telehealth: Payer: Self-pay

## 2022-12-28 NOTE — Telephone Encounter (Signed)
Patient requesting a 90 day supply of   citric acid-potassium citrate (POLYCITRA) 1100-334 MG/5ML solution   Please fill at:  [facility], [place] Phone: [phone]  Fax: [phone]

## 2022-12-29 ENCOUNTER — Other Ambulatory Visit: Payer: Self-pay

## 2022-12-29 DIAGNOSIS — N210 Calculus in bladder: Secondary | ICD-10-CM

## 2022-12-29 MED ORDER — POTASSIUM CITRATE-CITRIC ACID 1100-334 MG/5ML PO SOLN: ORAL | 1 refills | Status: DC

## 2022-12-29 NOTE — Telephone Encounter (Signed)
Rx sent to requested pharmacy

## 2022-12-29 NOTE — Progress Notes (Signed)
Patient requested Polycitra soultion be sent to [facility] in [place].  Rx updated.

## 2023-03-08 ENCOUNTER — Other Ambulatory Visit: Payer: Self-pay | Admitting: Gastroenterology

## 2023-04-29 ENCOUNTER — Encounter: Payer: Self-pay | Admitting: Gastroenterology

## 2023-04-29 ENCOUNTER — Ambulatory Visit (INDEPENDENT_AMBULATORY_CARE_PROVIDER_SITE_OTHER): Payer: Medicare Other | Admitting: Gastroenterology

## 2023-04-29 VITALS — BP 120/62 | HR 71 | Ht 70.0 in | Wt 215.0 lb

## 2023-04-29 DIAGNOSIS — K2100 Gastro-esophageal reflux disease with esophagitis, without bleeding: Secondary | ICD-10-CM | POA: Diagnosis not present

## 2023-04-29 DIAGNOSIS — Z86010 Personal history of colonic polyps: Secondary | ICD-10-CM

## 2023-04-29 MED ORDER — PANTOPRAZOLE SODIUM 40 MG PO TBEC: 40.0000 mg | DELAYED_RELEASE_TABLET | Freq: Every day | ORAL | 3 refills | Status: DC

## 2023-04-29 NOTE — Progress Notes (Signed)
    Assessment     GERD with LA Grade B esophagitis, h/o an esophageal stricture Personal history of adenomatous colon polyps    Recommendations    Continue pantoprazole 40 mg po qd, follow antireflux measures Colonoscopy in Oct 2028 REV in 1 year   HPI    This is a 66 year old male returning for follow-up of GERD with esophagitis and history of esophageal stricture.  His reflux symptoms are under very good control.  He has very rare breakthrough symptoms after eating fatty meal particularly if he eats later in the evening.  No dysphagia or odynophagia.  No other gastrointestinal complaints.   Labs / Imaging        No data to display             Latest Ref Rng & Units 01/11/2020    7:28 AM 11/09/2019   10:09 AM  CBC  Hemoglobin 13.0 - 17.0 g/dL 96.0  45.4   Hematocrit 39.0 - 52.0 % 45.0  43.0     Current Medications, Allergies, Past Medical History, Past Surgical History, Family History and Social History were reviewed in [employer] record.   Physical Exam: General: Well developed, well nourished, no acute distress Head: Normocephalic and atraumatic Eyes: Sclerae anicteric, EOMI Ears: Normal auditory acuity Mouth: No deformities or lesions noted Lungs: Clear throughout to auscultation Heart: Regular rate and rhythm; No murmurs, rubs or bruits Abdomen: Soft, non tender and non distended. No masses, hepatosplenomegaly or hernias noted. Normal Bowel sounds Rectal: Not done Musculoskeletal: Symmetrical with no gross deformities  Pulses:  Normal pulses noted Extremities: No edema or deformities noted Neurological: Alert oriented x 4, grossly nonfocal Psychological:  Alert and cooperative. Normal mood and affect   Lakesia Dahle T. Russella Dar, MD 04/29/2023, 9:20 AM

## 2023-04-29 NOTE — Patient Instructions (Addendum)
We have sent the following prescriptions to your mail in pharmacy: pantoprazole.   If you have not heard from your mail in pharmacy within 1 week or if you have not received your medication in the mail, please contact us at [phone] so we may find out why.  The [facility] providers would like to encourage you to use MYCHART to communicate with providers for non-urgent requests or questions.  Due to long hold times on the telephone, sending your provider a message by MYCHART may be a faster and more efficient way to get a response.  Please allow 48 business hours for a response.  Please remember that this is for non-urgent requests.   Thank you for choosing me and [facility].  Malcolm T. Stark, Jr., MD., FACG  

## 2023-09-15 ENCOUNTER — Other Ambulatory Visit: Payer: Self-pay | Admitting: Urology

## 2023-09-15 DIAGNOSIS — N210 Calculus in bladder: Secondary | ICD-10-CM

## 2023-09-15 NOTE — Telephone Encounter (Signed)
[employer] fax received for 90 day rx. New rx sent to pharmacy.

## 2023-10-27 ENCOUNTER — Other Ambulatory Visit: Payer: Medicare Other

## 2023-10-27 DIAGNOSIS — Z8546 Personal history of malignant neoplasm of prostate: Secondary | ICD-10-CM

## 2023-10-28 LAB — PSA: Prostate Specific Ag, Serum: 1.2 ng/mL (ref 0.0–4.0)

## 2023-11-04 ENCOUNTER — Ambulatory Visit (INDEPENDENT_AMBULATORY_CARE_PROVIDER_SITE_OTHER): Payer: Medicare Other | Admitting: Urology

## 2023-11-04 ENCOUNTER — Encounter: Payer: Self-pay | Admitting: Urology

## 2023-11-04 VITALS — BP 121/73 | HR 74

## 2023-11-04 DIAGNOSIS — N99112 Postprocedural membranous urethral stricture: Secondary | ICD-10-CM

## 2023-11-04 DIAGNOSIS — Z8551 Personal history of malignant neoplasm of bladder: Secondary | ICD-10-CM

## 2023-11-04 DIAGNOSIS — Z87442 Personal history of urinary calculi: Secondary | ICD-10-CM | POA: Diagnosis not present

## 2023-11-04 DIAGNOSIS — N210 Calculus in bladder: Secondary | ICD-10-CM

## 2023-11-04 DIAGNOSIS — Z8546 Personal history of malignant neoplasm of prostate: Secondary | ICD-10-CM

## 2023-11-04 MED ORDER — CIPROFLOXACIN HCL 500 MG PO TABS
500.0000 mg | ORAL_TABLET | Freq: Once | ORAL | Status: AC
  Administered 2023-11-04: 500 mg via ORAL

## 2023-11-04 NOTE — Progress Notes (Signed)
Subjective:  1. History of bladder cancer   2. Bladder stones   3. History of prostate cancer   4. Postprocedural membranous urethral stricture, male     11/04/23: Jack Mcdonald returns today in f/u for cystoscopy for his history of bladder cancer diagnosed in 2020.  His PSA is 1.2 which is minimally increased 10 years out from radiation therapy.  He has an adequate stream.  He has no frequency.  He has nocturia x 1-2 and no incontinence.  He has no hematuria and the UA is clear. He remains on polycitra K for prevention of dystrophic calcifications.   It was refilled in October.    10/29/22: Jack Mcdonald returns today in f/u for cystoscopy for the history below. His PSA is stable at 0.9.  His UA is clear.  He is doing well without associated signs or symptoms.   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  He had BCG induction and a year of maintenance.  He has a history of a membranous stricture.  He has a history of prostate cancer diagnosed in 2014 and treated with radiation therapy.  He had a TURP in 01/11/20 for dystrophic calcifications of the prostatic urethra and has remained clear on polycitra K and high fluid intake.  His PSA was stable at 1.1 prior to this visit.  His UA is clear.   10/16/21: Jack Mcdonald returns today in f/u for surveillance cystoscopy.  His cytology from 8/18 was negative. He had  a small red spot on the bladder wall so he is brought back for a 3 months cysto instead of 6 months.  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   07/17/21: Jack Mcdonald returns today for the history noted below for surveillance cystoscopy.  He had a PSA prior to this visit that was slightly lower at 0.9.   He reports a slower stream and hesitancy and he feels that the stricture maybe recurring.   His UA is clear today and he has had no hematuria.        ROS:  ROS:  A complete review of systems was  performed.  All systems are negative except for pertinent findings as noted.   Review of Systems  All other systems reviewed and are negative.   No Known Allergies   Outpatient Encounter Medications as of 11/04/2023  Medication Sig   citric acid-potassium citrate (POLYCITRA) 1100-334 MG/5ML solution TAKE 1 TEASPOONFUL BY MOUTH THREE TIMES DAILY   hydrochlorothiazide (MICROZIDE) 12.5 MG capsule Take 12.5 mg by mouth daily.    metoprolol tartrate (LOPRESSOR) 25 MG tablet Take 12.5 mg by mouth 2 (two) times daily.    pantoprazole (PROTONIX) 40 MG tablet Take 1 tablet (40 mg total) by mouth daily.   telmisartan (MICARDIS) 40 MG tablet Take 40 mg by mouth daily.   Facility-Administered Encounter Medications as of 11/04/2023  Medication   [COMPLETED] ciprofloxacin (CIPRO) tablet 500 mg   gemcitabine (GEMZAR) chemo syringe for bladder instillation 2,000 mg    Past Medical History:  Diagnosis Date   Bladder cancer (HCC)    imm therapy    BPH with urinary obstruction    urologist--- dr Annabell Howells   Dysphagia    01-05-2020  per pt states chews well and small bites   History of external beam radiation therapy    prostate cancer--- completed 2014   History of kidney stones    History of prostate cancer  urologist--- dr Annabell Howells   dx 02-21-2013   Stage T2a, Gleason 3+4;   completed radiation   Hypertension    followed by cardiology-- Dr Altamease Oiler [facility]   per cardiololgy note pt had ETT 03/ 2018 negative for ishcemia   Moderate aortic regurgitation    per 06-24-19 echo dr Caryn Bee [facility]  (scanned in media in epic)   Nocturia    Palpitations    followed by cardiologist--- dr Kirtland Bouchard. Leonie Green;  per dr Leonie Green note , holter monior 01/ 2018 infrequent supraventricular and ventricular ectopic activity   Prostate calculus    Prostate cancer (HCC) 01/04/13   Adenocarcinoma,gleason=3+4=7,& 3+3=6,PSA=3.94,Volume=63cc   S/P TURP    Tubular adenoma of  colon 2017   Urethral stricture    Wears glasses     Past Surgical History:  Procedure Laterality Date   COLONOSCOPY  age 59   few polyps   CYSTOSCOPY WITH URETHRAL DILATATION N/A 01/11/2020   Procedure: CYSTOSCOPY WITH URETHRAL DILATATION AND LITHOLAPAXY;  Surgeon: Bjorn Pippin, MD;  Location: [facility];  Service: Urology;  Laterality: N/A;   PROSTATE BIOPSY  01/04/13   Adenocarncinoma   TRANSURETHRAL RESECTION OF BLADDER TUMOR WITH MITOMYCIN-C Bilateral 11/09/2019   Procedure: TRANSURETHRAL RESECTION OF BLADDER TUMOR;  Surgeon: Bjorn Pippin, MD;  Location: [facility];  Service: Urology;  Laterality: Bilateral;   TRANSURETHRAL RESECTION OF PROSTATE N/A 01/11/2020   Procedure: TRANSURETHRAL RESECTION REMOVAL OF STONES;  Surgeon: Bjorn Pippin, MD;  [facility];  Service: Urology;  Laterality: N/A;    Social History   Socioeconomic History   Marital status: Married    Spouse name: Not on file   Number of children: 1   Years of education: Not on file   Highest education level: Not on file  Occupational History   Occupation: Education administrator  Tobacco Use   Smoking status: Never   Smokeless tobacco: Never  Vaping Use   Vaping status: Never Used  Substance and Sexual Activity   Alcohol use: Yes    Comment: 2 per day mixed drinks   Drug use: No   Sexual activity: Yes  Other Topics Concern   Not on file  Social History Narrative   Not on file   Social Determinants of Health   Financial Resource Strain: Not on file  Food Insecurity: Not on file  Transportation Needs: Not on file  Physical Activity: Not on file  Stress: Not on file  Social Connections: Not on file  Intimate Partner Violence: Not on file    Family History  Problem Relation Age of Onset   Colon cancer Neg Hx    Colon polyps Neg Hx    Rectal cancer Neg Hx    Stomach cancer Neg Hx    Pancreatic cancer Neg Hx    Esophageal cancer Neg Hx         Objective: Vitals:   11/04/23 0907  BP: 121/73  Pulse: 74      Physical Exam  Lab Results:  No results found for this or any previous visit (from the past 24 hour(s)).   BMET No results for input(s): "NA", "K", "CL", "CO2", "GLUCOSE", "BUN", "CREATININE", "CALCIUM" in the last 72 hours. PSA Recent Results (from the past 2160 hour(s))  PSA     Status: None   Collection Time: 10/27/23  8:51 AM  Result Value Ref Range   Prostate Specific Ag, Serum 1.2 0.0 - 4.0  ng/mL    Comment: Roche ECLIA methodology. According to the American Urological Association, Serum PSA should decrease and remain at undetectable levels after radical prostatectomy. The AUA defines biochemical recurrence as an initial PSA value 0.2 ng/mL or greater followed by a subsequent confirmatory PSA value 0.2 ng/mL or greater. Values obtained with different assay methods or kits cannot be used interchangeably. Results cannot be interpreted as absolute evidence of the presence or absence of malignant disease.   Urinalysis, Routine w reflex microscopic     Status: Abnormal   Collection Time: 11/04/23  9:25 AM  Result Value Ref Range   Specific Gravity, UA 1.020 1.005 - 1.030   pH, UA 6.5 5.0 - 7.5   Color, UA Yellow Yellow   Appearance Ur Clear Clear   Leukocytes,UA Negative Negative   Protein,UA Negative Negative/Trace   Glucose, UA Negative Negative   Ketones, UA Trace (A) Negative   RBC, UA Negative Negative   Bilirubin, UA Negative Negative   Urobilinogen, Ur 2.0 (H) 0.2 - 1.0 mg/dL   Nitrite, UA Negative Negative   Microscopic Examination Comment     Comment: Microscopic not indicated and not performed.    Lab Results  Component Value Date   PSA1 1.2 10/27/2023   PSA1 0.9 10/21/2022   PSA1 1.1 04/09/2022    UA is clear.   Report entry not complete.   Studies/Results: Cystoscopy:   He was prepped with betadine, draped with sterile towels.  He was given cipro to cover the  procedure.  The well lubricated flexible scope was passed.  Urethra: normal with the exception of a mod membranous stricture.  I was able to pass the scope and disrupt the stricture to some degree. . Prostate: Bilobar hyperplasia with with radiation changes and minimal obstruction no recurrent stones. Bladder: smooth without mucosal lesions other than radiation neovascularity at the bladder neck.    Ureteral orifices: normal  Complications: none.     Assessment & Plan: Membranous stricture.  There is mod recurrent stricturing that didn't impede the scope passage.  Bladder and prostatic urethral dystrophic calcification have resolved with removal and medical therapy.[name]   He will continue the high fluid intake and polycitra.   History of bladder cancer.   No recurrences noted but he has some radiation changes.  He will return in 12 months for cystoscopy.  With his intermediate risk low grade disease I don't think he needs further maintenance BCG.    History of prostate cancer.  PSA is minimally increased to 1.2 from 0.9 but it has been up to 1.1 in the past.   Repeat in a year.    BOO.  He is off of the tamsulosin.      Meds ordered this encounter  Medications   ciprofloxacin (CIPRO) tablet 500 mg      Orders Placed This Encounter  Procedures   Urinalysis, Routine w reflex microscopic   PSA    Standing Status:   Future    Standing Expiration Date:   11/03/2024   Cystoscopy       Return in about 1 year (around 11/03/2024) for Cysto and PSA. [name]   CC: Dhivianathan, Birdie Hopes, MD      Bjorn Pippin 11/07/2023 Patient ID: Jack Mcdonald, male   DOB: 1955-12-11, 67 y.o.   MRN: [id number] Patient ID: Jack Mcdonald, male   DOB: 13-Jan-1956, 67 y.o.   MRN: [id number]

## 2023-11-05 LAB — URINALYSIS, ROUTINE W REFLEX MICROSCOPIC
Bilirubin, UA: NEGATIVE
Glucose, UA: NEGATIVE
Leukocytes,UA: NEGATIVE
Nitrite, UA: NEGATIVE
Protein,UA: NEGATIVE
RBC, UA: NEGATIVE
Specific Gravity, UA: 1.02 (ref 1.005–1.030)
Urobilinogen, Ur: 2 mg/dL — ABNORMAL HIGH (ref 0.2–1.0)
pH, UA: 6.5 (ref 5.0–7.5)

## 2024-03-03 ENCOUNTER — Telehealth: Payer: Self-pay | Admitting: Gastroenterology

## 2024-03-03 MED ORDER — PANTOPRAZOLE SODIUM 40 MG PO TBEC: 40.0000 mg | DELAYED_RELEASE_TABLET | Freq: Every day | ORAL | 0 refills | Status: DC

## 2024-03-03 NOTE — Telephone Encounter (Signed)
 Inbound call from patient requesting a refill for pantoprazole medication. Patient has been scheduled for 5/9. Please advise, thank you.

## 2024-03-03 NOTE — Telephone Encounter (Signed)
 Prescription sent to patient's pharmacy until scheduled appt.

## 2024-04-06 NOTE — Progress Notes (Signed)
 se     Brigitte Canard, PA-C [address] [place], [place]  [address] Phone: [phone]   Primary Care Physician: Dhivianathan, Lidia Reels, MD  Primary Gastroenterologist:  Brigitte Canard, PA-C / Lajuan Pila, MD   Chief Complaint: Medication refill, follow-up GERD       HPI:   Jack Mcdonald is a 68 y.o. male, previous patient Dr. Sandrea Cruel, presents for annual follow-up of acid reflux with esophagitis.  Last seen here 03/2023.  Currently takes pantoprazole  40 Mg daily and is doing well.  He denies dysphagia or breakthrough heartburn.  He has no GI symptoms or concerns today.  Is eating well with no difficulty.  Needs medication refill.  He is requesting to establish care with Dr. Venice Gillis since Dr. Sandrea Cruel has retired.  08/2020 last EGD by Dr. Sandrea Cruel: LA grade B esophagitis, severe lower esophageal stricture dilated to 13 mm.  Normal stomach.  Two 7 mm and 8 mm mucosal nodules in the duodenal bulb.  Biopsies - PEPTIC DUODENITIS.- NO DYSPLASIA OR MALIGNANCY.  - MILD REACTIVE GASTROPATHY. Jack Mcdonald IS NEGATIVE FOR HELICOBACTER PYLORI. - NO INTESTINAL METAPLASIA, DYSPLASIA, OR MALIGNANCY.  08/2020 colonoscopy: Excellent prep.  One small 7 mm tubular adenoma polyp removed from transverse colon.  Mild diverticulosis.  Small internal hemorrhoids.  7-year repeat (due 08/2027).  Current Outpatient Medications  Medication Sig Dispense Refill   citric acid -potassium citrate  (POLYCITRA) 1100-334 MG/5ML solution TAKE 1 TEASPOONFUL BY MOUTH THREE TIMES DAILY 1419 mL 3   hydrochlorothiazide (MICROZIDE) 12.5 MG capsule Take 12.5 mg by mouth daily.      metoprolol tartrate (LOPRESSOR) 25 MG tablet Take 12.5 mg by mouth 2 (two) times daily.      telmisartan (MICARDIS) 40 MG tablet Take 40 mg by mouth daily.     pantoprazole  (PROTONIX ) 40 MG tablet Take 1 tablet (40 mg total) by mouth daily. 90 tablet 3   No current facility-administered medications for this visit.   Facility-Administered Medications  Ordered in Other Visits  Medication Dose Route Frequency Provider Last Rate Last Admin   gemcitabine  (GEMZAR ) chemo syringe for bladder instillation 2,000 mg  2,000 mg Bladder Instillation Once Wrenn, John, MD        Allergies as of 04/07/2024   (No Known Allergies)    Past Medical History:  Diagnosis Date   Bladder cancer (HCC)    imm therapy    BPH with urinary obstruction    urologist--- dr Inga Manges   Dysphagia    01-05-2020  per pt states chews well and small bites   History of external beam radiation therapy    prostate cancer--- completed 2014   History of kidney stones    History of prostate cancer urologist--- dr Inga Manges   dx 02-21-2013   Stage T2a, Gleason 3+4;   completed radiation   Hypertension    followed by cardiology-- Dr Mona Angle [facility]   per cardiololgy note pt had ETT 03/ 2018 negative for ishcemia   Moderate aortic regurgitation    per 06-24-19 echo dr Ernestina Headland [facility]  (scanned in media in epic)   Nocturia    Palpitations    followed by cardiologist--- dr Linnell Richardson. Gracia Lauth;  per dr Gracia Lauth note , holter monior 01/ 2018 infrequent supraventricular and ventricular ectopic activity   Prostate calculus    Prostate cancer (HCC) 01/04/13   Adenocarcinoma,gleason=3+4=7,& 3+3=6,PSA=3.94,Volume=63cc   S/P TURP    Tubular adenoma of colon 2017   Urethral stricture  Wears glasses     Past Surgical History:  Procedure Laterality Date   COLONOSCOPY  age 67   few polyps   CYSTOSCOPY WITH URETHRAL DILATATION N/A 01/11/2020   Procedure: CYSTOSCOPY WITH URETHRAL DILATATION AND LITHOLAPAXY;  Surgeon: Homero Luster, MD;  Location: [facility];  Service: Urology;  Laterality: N/A;   PROSTATE BIOPSY  01/04/13   Adenocarncinoma   TRANSURETHRAL RESECTION OF BLADDER TUMOR WITH MITOMYCIN -C Bilateral 11/09/2019   Procedure: TRANSURETHRAL RESECTION OF BLADDER TUMOR;  Surgeon: Homero Luster, MD;  Location: [facility];  Service: Urology;  Laterality: Bilateral;   TRANSURETHRAL RESECTION OF PROSTATE N/A 01/11/2020   Procedure: TRANSURETHRAL RESECTION REMOVAL OF STONES;  Surgeon: Homero Luster, MD;  Location: [facility];  Service: Urology;  Laterality: N/A;    Review of Systems:    All systems reviewed and negative except where noted in HPI.    Physical Exam:  BP 116/68   Pulse 72   Ht 5\' 10"  (1.778 m)   Wt 213 lb 4 oz (96.7 kg)   SpO2 95%   BMI 30.60 kg/m  No LMP for male patient.  General: Well-nourished, well-developed in no acute distress.  Neuro: Alert and oriented x 3.  Grossly intact.  Psych: Alert and cooperative, normal mood and affect.   Imaging Studies: No results found.  Labs: CBC    Component Value Date/Time   HGB 15.3 01/11/2020 0728   HCT 45.0 01/11/2020 0728    CMP     Component Value Date/Time   NA 137 01/11/2020 0728   K 4.2 01/11/2020 0728   CL 101 01/11/2020 0728   GLUCOSE 101 (H) 01/11/2020 0728   BUN 11 01/11/2020 0728   CREATININE 0.60 (L) 01/11/2020 0728       Assessment and Plan:   Jack Mcdonald is a 68 y.o. y/o male returns for follow-up of GERD with esophagitis.  History of severe esophageal stricture dilated to 13 mm with last EGD 08/2020.  1.  GERD with esophagitis -controlled on current PPI. - Refilled pantoprazole  40 Mg once daily, #90, 3 refills.  2.  History of lower esophageal stricture - If he has any recurrent dysphagia symptoms, then we can schedule repeat EGD.  Patient expressed understanding.  3.  History of adenomatous colon polyp - 7-year repeat colonoscopy will be due 08/2027.    Brigitte Canard, PA-C  Follow up 1 year or sooner if recurrent GI symptoms.

## 2024-04-07 ENCOUNTER — Ambulatory Visit (INDEPENDENT_AMBULATORY_CARE_PROVIDER_SITE_OTHER): Admitting: Physician Assistant

## 2024-04-07 ENCOUNTER — Encounter: Payer: Self-pay | Admitting: Physician Assistant

## 2024-04-07 VITALS — BP 116/68 | HR 72 | Ht 70.0 in | Wt 213.2 lb

## 2024-04-07 DIAGNOSIS — Z8719 Personal history of other diseases of the digestive system: Secondary | ICD-10-CM | POA: Diagnosis not present

## 2024-04-07 DIAGNOSIS — Z860101 Personal history of adenomatous and serrated colon polyps: Secondary | ICD-10-CM

## 2024-04-07 DIAGNOSIS — K2100 Gastro-esophageal reflux disease with esophagitis, without bleeding: Secondary | ICD-10-CM

## 2024-04-07 MED ORDER — PANTOPRAZOLE SODIUM 40 MG PO TBEC: 40.0000 mg | DELAYED_RELEASE_TABLET | Freq: Every day | ORAL | 3 refills | Status: AC

## 2024-04-07 NOTE — Patient Instructions (Signed)
 _______________________________________________________  If your blood pressure at your visit was 140/90 or greater, please contact your primary care physician to follow up on this. _______________________________________________________  If you are age 70 or older, your body mass index should be between 23-30. Your Body mass index is 30.6 kg/m. If this is out of the aforementioned range listed, please consider follow up with your Primary Care Provider. ________________________________________________________  The [facility] providers would like to encourage you to use MYCHART to communicate with providers for non-urgent requests or questions.  Due to long hold times on the telephone, sending your provider a message by [facility] may be a faster and more efficient way to get a response.  Please allow 48 business hours for a response.  Please remember that this is for non-urgent requests.  _______________________________________________________  We have sent the following medications to your pharmacy for you to pick up at your convenience: CONTINUE: pantoprazole  40mg  daily  Thank you for entrusting me with your care and choosing [facility].  Brigitte Canard, PA-C

## 2024-04-22 NOTE — Progress Notes (Signed)
 Agree with assessment/plan.  Edman Circle, MD [facility] [phone]

## 2024-10-06 ENCOUNTER — Telehealth: Payer: Self-pay

## 2024-10-06 DIAGNOSIS — Z8551 Personal history of malignant neoplasm of bladder: Secondary | ICD-10-CM

## 2024-10-06 NOTE — Telephone Encounter (Signed)
 Patient upcoming appointment scheduled with Dr. Watt.  I called patient to discuss scheduling options. Patient would like a referral to see Dr. Watt with [facility].  Referral order placed.

## 2024-11-03 ENCOUNTER — Other Ambulatory Visit: Payer: Medicare Other

## 2024-11-09 ENCOUNTER — Other Ambulatory Visit: Payer: Medicare Other | Admitting: Urology

## 2024-12-04 ENCOUNTER — Other Ambulatory Visit: Payer: Self-pay | Admitting: Urology

## 2024-12-04 DIAGNOSIS — N210 Calculus in bladder: Secondary | ICD-10-CM

## 2024-12-13 ENCOUNTER — Other Ambulatory Visit: Payer: Self-pay

## 2024-12-13 DIAGNOSIS — N210 Calculus in bladder: Secondary | ICD-10-CM

## 2024-12-14 ENCOUNTER — Other Ambulatory Visit: Payer: Self-pay

## 2024-12-14 ENCOUNTER — Other Ambulatory Visit

## 2024-12-14 DIAGNOSIS — C61 Malignant neoplasm of prostate: Secondary | ICD-10-CM

## 2024-12-15 LAB — PSA: Prostate Specific Ag, Serum: 1.5 ng/mL (ref 0.0–4.0)

## 2024-12-15 MED ORDER — POTASSIUM CITRATE-CITRIC ACID 1100-334 MG/5ML PO SOLN: ORAL | 3 refills | Status: DC

## 2024-12-19 ENCOUNTER — Ambulatory Visit: Payer: Self-pay | Admitting: Urology

## 2024-12-20 ENCOUNTER — Ambulatory Visit: Admitting: Urology

## 2024-12-20 ENCOUNTER — Encounter: Payer: Self-pay | Admitting: Urology

## 2024-12-20 VITALS — BP 121/70 | HR 73

## 2024-12-20 DIAGNOSIS — Z8551 Personal history of malignant neoplasm of bladder: Secondary | ICD-10-CM

## 2024-12-20 DIAGNOSIS — N210 Calculus in bladder: Secondary | ICD-10-CM

## 2024-12-20 DIAGNOSIS — C61 Malignant neoplasm of prostate: Secondary | ICD-10-CM

## 2024-12-20 LAB — URINALYSIS, ROUTINE W REFLEX MICROSCOPIC
Bilirubin, UA: NEGATIVE
Glucose, UA: NEGATIVE
Leukocytes,UA: NEGATIVE
Nitrite, UA: NEGATIVE
Protein,UA: NEGATIVE
RBC, UA: NEGATIVE
Specific Gravity, UA: 1.015 (ref 1.005–1.030)
Urobilinogen, Ur: 1 mg/dL (ref 0.2–1.0)
pH, UA: 7.5 (ref 5.0–7.5)

## 2024-12-20 MED ORDER — CIPROFLOXACIN HCL 500 MG PO TABS
500.0000 mg | ORAL_TABLET | Freq: Once | ORAL | Status: AC
  Administered 2024-12-20: 500 mg via ORAL

## 2024-12-20 MED ORDER — POTASSIUM CITRATE-CITRIC ACID 1100-334 MG/5ML PO SOLN: ORAL | 3 refills | Status: AC

## 2024-12-20 NOTE — Progress Notes (Unsigned)
" ° °  12/20/24  CC: followup bladder cancer   HPI: Jack Mcdonald is a 68yo here for cystoscopy for a history of bladder cancer Blood pressure 121/70, pulse 73. NED. A&Ox3.   No respiratory distress   Abd soft, NT, ND Normal phallus with bilateral descended testicles  Cystoscopy Procedure Note  Patient identification was confirmed, informed consent was obtained, and patient was prepped using Betadine solution.  Lidocaine  jelly was administered per urethral meatus.     Pre-Procedure: - Inspection reveals a normal caliber ureteral meatus.  Procedure: The flexible cystoscope was introduced without difficulty - bulbar urethral stricture present which was dilated with the cystoscope. - Enlarged prostate  - Normal bladder neck - Bilateral ureteral orifices identified - Bladder mucosa  reveals no ulcers, tumors, or lesions - No bladder stones - No trabeculation   Post-Procedure: - Patient tolerated the procedure well  Assessment/ Plan: Followup 1 year with PSA and for cystoscopy  No follow-ups on file.  Belvie Clara, MD  "

## 2024-12-20 NOTE — Patient Instructions (Signed)

## 2024-12-21 LAB — CYTOLOGY, URINE
